# Patient Record
Sex: Male | Born: 1960 | Race: White | Hispanic: No | Marital: Single | Smoking: Never smoker
Health system: Southern US, Community
[De-identification: ages and names within clinical notes are randomized; demographics above are authoritative.]

## PROBLEM LIST (undated history)

## (undated) DIAGNOSIS — F419 Anxiety disorder, unspecified: Secondary | ICD-10-CM

## (undated) HISTORY — DX: Anxiety disorder, unspecified: F41.9

---

## 1999-01-15 ENCOUNTER — Ambulatory Visit (HOSPITAL_COMMUNITY): Admission: RE | Admit: 1999-01-15 | Discharge: 1999-01-15 | Payer: Self-pay | Admitting: Psychiatry

## 1999-01-18 ENCOUNTER — Other Ambulatory Visit (HOSPITAL_COMMUNITY): Admission: RE | Admit: 1999-01-18 | Discharge: 1999-02-04 | Payer: Self-pay | Admitting: Psychiatry

## 2001-10-12 ENCOUNTER — Emergency Department (HOSPITAL_COMMUNITY): Admission: EM | Admit: 2001-10-12 | Discharge: 2001-10-12 | Payer: Self-pay | Admitting: Emergency Medicine

## 2001-10-15 ENCOUNTER — Emergency Department (HOSPITAL_COMMUNITY): Admission: EM | Admit: 2001-10-15 | Discharge: 2001-10-15 | Payer: Self-pay | Admitting: Emergency Medicine

## 2001-11-04 ENCOUNTER — Emergency Department (HOSPITAL_COMMUNITY): Admission: EM | Admit: 2001-11-04 | Discharge: 2001-11-05 | Payer: Self-pay | Admitting: Emergency Medicine

## 2001-11-05 ENCOUNTER — Encounter: Payer: Self-pay | Admitting: Emergency Medicine

## 2002-01-17 ENCOUNTER — Emergency Department (HOSPITAL_COMMUNITY): Admission: EM | Admit: 2002-01-17 | Discharge: 2002-01-17 | Payer: Self-pay | Admitting: Emergency Medicine

## 2004-02-09 ENCOUNTER — Ambulatory Visit: Payer: Self-pay | Admitting: Family Medicine

## 2004-03-17 ENCOUNTER — Ambulatory Visit: Payer: Self-pay | Admitting: Family Medicine

## 2004-06-08 ENCOUNTER — Ambulatory Visit: Payer: Self-pay | Admitting: Family Medicine

## 2004-06-14 ENCOUNTER — Ambulatory Visit: Payer: Self-pay | Admitting: Family Medicine

## 2004-07-04 ENCOUNTER — Ambulatory Visit: Payer: Self-pay | Admitting: Family Medicine

## 2004-07-14 ENCOUNTER — Ambulatory Visit: Payer: Self-pay | Admitting: Family Medicine

## 2004-07-20 ENCOUNTER — Ambulatory Visit: Payer: Self-pay | Admitting: Family Medicine

## 2004-07-25 ENCOUNTER — Ambulatory Visit: Payer: Self-pay | Admitting: Family Medicine

## 2004-08-04 ENCOUNTER — Ambulatory Visit: Payer: Self-pay | Admitting: Internal Medicine

## 2004-08-11 ENCOUNTER — Ambulatory Visit: Payer: Self-pay | Admitting: Family Medicine

## 2004-08-18 ENCOUNTER — Ambulatory Visit: Payer: Self-pay | Admitting: Internal Medicine

## 2004-08-24 ENCOUNTER — Ambulatory Visit: Payer: Self-pay | Admitting: Family Medicine

## 2004-11-09 ENCOUNTER — Ambulatory Visit: Payer: Self-pay | Admitting: Family Medicine

## 2004-12-21 ENCOUNTER — Ambulatory Visit: Payer: Self-pay | Admitting: Family Medicine

## 2005-01-04 ENCOUNTER — Ambulatory Visit: Payer: Self-pay | Admitting: Family Medicine

## 2009-09-01 ENCOUNTER — Encounter (INDEPENDENT_AMBULATORY_CARE_PROVIDER_SITE_OTHER): Payer: Self-pay | Admitting: *Deleted

## 2010-05-10 NOTE — Letter (Signed)
Summary: Colonoscopy Letter  [facility]  [address] [place], [place] [address]   Phone: [phone]  Fax: [phone]      Sep 01, 2009 MRN: [id number]   Dean Martin [address] [place], [place]  [address]   Dear Mr. KILMER,   According to your medical record, it is time for you to schedule a Colonoscopy. The American Cancer Society recommends this procedure as a method to detect early colon cancer. Patients with a family history of colon cancer, or a personal history of colon polyps or inflammatory bowel disease are at increased risk.  This letter has beeen generated based on the recommendations made at the time of your procedure. If you feel that in your particular situation this may no longer apply, please contact our office.  Please call our office at [phone] to schedule this appointment or to update your records at your earliest convenience.  Thank you for cooperating with [place] to provide you with the very best care possible.   Sincerely,  Iva Boop, M.D.  [facility] [phone]

## 2010-11-17 ENCOUNTER — Telehealth: Payer: Self-pay | Admitting: Gastroenterology

## 2010-11-17 NOTE — Telephone Encounter (Signed)
Called all numbers in the system, one number not working and work number-the patient hasn't worked there in over ten years. Noted to send the patient a letter.

## 2011-07-18 ENCOUNTER — Encounter: Payer: Self-pay | Admitting: Internal Medicine

## 2019-10-30 ENCOUNTER — Telehealth (INDEPENDENT_AMBULATORY_CARE_PROVIDER_SITE_OTHER): Payer: 59 | Admitting: Adult Health

## 2019-10-30 ENCOUNTER — Encounter: Payer: Self-pay | Admitting: Adult Health

## 2019-10-30 VITALS — Ht 71.0 in | Wt 165.0 lb

## 2019-10-30 DIAGNOSIS — G629 Polyneuropathy, unspecified: Secondary | ICD-10-CM | POA: Diagnosis not present

## 2019-10-30 DIAGNOSIS — F419 Anxiety disorder, unspecified: Secondary | ICD-10-CM | POA: Diagnosis not present

## 2019-10-30 DIAGNOSIS — N529 Male erectile dysfunction, unspecified: Secondary | ICD-10-CM

## 2019-10-30 DIAGNOSIS — Z7689 Persons encountering health services in other specified circumstances: Secondary | ICD-10-CM

## 2019-10-30 MED ORDER — ALPRAZOLAM 0.25 MG PO TABS: 0.2500 mg | ORAL_TABLET | Freq: Two times a day (BID) | ORAL | 2 refills | Status: AC

## 2019-10-30 MED ORDER — SILDENAFIL CITRATE 100 MG PO TABS: 100.0000 mg | ORAL_TABLET | Freq: Every day | ORAL | 3 refills | Status: DC | PRN

## 2019-10-30 NOTE — Progress Notes (Signed)
Virtual Visit via Video Note  I connected withJohn B Martin on 10/30/19 at  4:00 PM EDT by a video enabled telemedicine application and verified that I am speaking with the correct person using two identifiers.  Location patient: home Location provider:work or home office Persons participating in the virtual visit: patient, provider  I discussed the limitations of evaluation and management by telemedicine and the availability of in person appointments. The patient expressed understanding and agreed to proceed.   Patient presents to clinic today to establish care.  He was originally from [place] but spent the last 9 years in [place] and has since moved back to [place], [place]. He was being seen by a PCP at [facility]   Acute Concerns: Establish Care   Chronic Issues: Anxiety - takes Xanax 0.25 mg BID. Feels like this works well for him.   ED - takes Viagra as needed  Neuropathy -ports that he has been seen by specialist in the past and has been tried on gabapentin, Lyrica, and Mirapex without symptomatic relief.  Some days are better than others and can go a couple of weeks without symptoms but then some tingling comes back.  Health Maintenance: Dental --Routine  Vision -- Routine  Immunizations -- UTD Colonoscopy -- 2016 - no polyps  Exercise: Does not exercise but stays active with work  Diet: Tries to eat healthy     Past Medical History:  Diagnosis Date  . Anxiety     History reviewed. No pertinent surgical history.  Current Outpatient Medications on File Prior to Visit  Medication Sig Dispense Refill  . ALPRAZolam (XANAX) 0.25 MG tablet Take 0.25 mg by mouth 2 (two) times daily as needed for anxiety.    . sildenafil (REVATIO) 20 MG tablet TAKE 1-5 TABS AS NEEDED     No current facility-administered medications on file prior to visit.    No Known Allergies  Family History  Problem Relation Age of Onset  . Colon cancer Father   . Diabetes Father      Social History   Socioeconomic History  . Marital status: Single    Spouse name: Not on file  . Number of children: Not on file  . Years of education: Not on file  . Highest education level: Not on file  Occupational History  . Not on file  Tobacco Use  . Smoking status: Never Smoker  . Smokeless tobacco: Never Used  Vaping Use  . Vaping Use: Never used  Substance and Sexual Activity  . Alcohol use: Not on file    Comment: OCCASIONAL  . Drug use: Never  . Sexual activity: Yes    Partners: Female  Other Topics Concern  . Not on file  Social History Narrative  . Not on file   Social Determinants of Health   Financial Resource Strain:   . Difficulty of Paying Living Expenses:   Food Insecurity:   . Worried About Programme researcher, broadcasting/film/video in the Last Year:   . Barista in the Last Year:   Transportation Needs:   . Freight forwarder (Medical):   Marland Kitchen Lack of Transportation (Non-Medical):   Physical Activity:   . Days of Exercise per Week:   . Minutes of Exercise per Session:   Stress:   . Feeling of Stress :   Social Connections:   . Frequency of Communication with Friends and Family:   . Frequency of Social Gatherings with Friends and Family:   .  Attends Religious Services:   . Active Member of Clubs or Organizations:   . Attends Banker Meetings:   Marland Kitchen Marital Status:   Intimate Partner Violence:   . Fear of Current or Ex-Partner:   . Emotionally Abused:   Marland Kitchen Physically Abused:   . Sexually Abused:     Review of Systems  Constitutional: Negative.   HENT: Negative.   Eyes: Negative.   Respiratory: Negative.   Cardiovascular: Negative.   Gastrointestinal: Negative.   Genitourinary: Negative.   Musculoskeletal: Negative.   Skin: Negative.   Neurological: Negative.   Endo/Heme/Allergies: Negative.   Psychiatric/Behavioral: The patient is nervous/anxious.   All other systems reviewed and are negative.   Ht 5\' 11"  (1.803 m)   Wt 165  lb (74.8 kg)   BMI 23.01 kg/m   Physical Exam VITALS per patient if applicable:  GENERAL: alert, oriented, appears well and in no acute distress  HEENT: atraumatic, conjunttiva clear, no obvious abnormalities on inspection of external nose and ears  NECK: normal movements of the head and neck  LUNGS: on inspection no signs of respiratory distress, breathing rate appears normal, no obvious gross SOB, gasping or wheezing  CV: no obvious cyanosis  MS: moves all visible extremities without noticeable abnormality  PSYCH/NEURO: pleasant and cooperative, no obvious depression or anxiety, speech and thought processing grossly intact  No results found for this or any previous visit (from the past 2160 hour(s)).  Assessment/Plan:  Discussed the following assessment and plan:  1. Encounter to establish care - Follow up for CPE - needs to exercise and eat healthier    2. Anxiety - Controlled substance database reviewed and no red flags found  - ALPRAZolam (XANAX) 0.25 MG tablet; Take 1 tablet (0.25 mg total) by mouth 2 (two) times daily.  Dispense: 60 tablet; Refill: 2  3. Erectile dysfunction, unspecified erectile dysfunction type  - sildenafil (VIAGRA) 100 MG tablet; Take 1 tablet (100 mg total) by mouth daily as needed for erectile dysfunction.  Dispense: 30 tablet; Refill: 3  4. Neuropathy - Will have him follow up in office for further evaluation      I discussed the assessment and treatment plan with the patient. The patient was provided an opportunity to ask questions and all were answered. The patient agreed with the plan and demonstrated an understanding of the instructions.   The patient was advised to call back or seek an in-person evaluation if the symptoms worsen or if the condition fails to improve as anticipated.   2161, NP

## 2020-02-05 ENCOUNTER — Encounter: Payer: Self-pay | Admitting: Adult Health

## 2020-02-05 ENCOUNTER — Telehealth (INDEPENDENT_AMBULATORY_CARE_PROVIDER_SITE_OTHER): Payer: 59 | Admitting: Adult Health

## 2020-02-05 VITALS — Wt 162.0 lb

## 2020-02-05 DIAGNOSIS — M8949 Other hypertrophic osteoarthropathy, multiple sites: Secondary | ICD-10-CM

## 2020-02-05 DIAGNOSIS — T63441A Toxic effect of venom of bees, accidental (unintentional), initial encounter: Secondary | ICD-10-CM

## 2020-02-05 DIAGNOSIS — F419 Anxiety disorder, unspecified: Secondary | ICD-10-CM

## 2020-02-05 MED ORDER — EPINEPHRINE 0.3 MG/0.3ML IJ SOAJ: 0.3000 mg | INTRAMUSCULAR | 1 refills | Status: AC | PRN

## 2020-02-05 MED ORDER — ALPRAZOLAM 0.25 MG PO TABS: 0.2500 mg | ORAL_TABLET | Freq: Two times a day (BID) | ORAL | 2 refills | Status: DC | PRN

## 2020-02-05 MED ORDER — IBUPROFEN 600 MG PO TABS: 600.0000 mg | ORAL_TABLET | Freq: Four times a day (QID) | ORAL | 0 refills | Status: DC | PRN

## 2020-02-05 NOTE — Progress Notes (Signed)
Virtual Visit via Video Note  I connected with Dean Martin on 02/05/20 at  4:00 PM EDT by a video enabled telemedicine application and verified that I am speaking with the correct person using two identifiers.  Location patient: home Location provider:work or home office Persons participating in the virtual visit: patient, provider  I discussed the limitations of evaluation and management by telemedicine and the availability of in person appointments. The patient expressed understanding and agreed to proceed.   HPI: 59 year old male being evaluated today for medication refills.  He has an upcoming physical roughly 5 weeks.  He takes Xanax 0.25 mg daily as needed and needs a refill of this medication.  He also needs a refill of Motrin 600 mg that he takes as needed for aches and pains as well as a refill of his EpiPen.  He reports that he is doing well and has no other complaints   ROS: See pertinent positives and negatives per HPI.  Past Medical History:  Diagnosis Date   Anxiety     History reviewed. No pertinent surgical history.  Family History  Problem Relation Age of Onset   Colon cancer Father    Diabetes Father        Current Outpatient Medications:    ALPRAZolam (XANAX) 0.25 MG tablet, Take 0.25 mg by mouth 2 (two) times daily as needed for anxiety., Disp: , Rfl:    sildenafil (REVATIO) 20 MG tablet, TAKE 1-5 TABS AS NEEDED, Disp: , Rfl:    sildenafil (VIAGRA) 100 MG tablet, Take 1 tablet (100 mg total) by mouth daily as needed for erectile dysfunction., Disp: 30 tablet, Rfl: 3  EXAM:  VITALS per patient if applicable:  GENERAL: alert, oriented, appears well and in no acute distress  HEENT: atraumatic, conjunttiva clear, no obvious abnormalities on inspection of external nose and ears  NECK: normal movements of the head and neck  LUNGS: on inspection no signs of respiratory distress, breathing rate appears normal, no obvious gross SOB, gasping or  wheezing  CV: no obvious cyanosis  MS: moves all visible extremities without noticeable abnormality  PSYCH/NEURO: pleasant and cooperative, no obvious depression or anxiety, speech and thought processing grossly intact  ASSESSMENT AND PLAN:  Discussed the following assessment and plan:  No diagnosis found.     I discussed the assessment and treatment plan with the patient. The patient was provided an opportunity to ask questions and all were answered. The patient agreed with the plan and demonstrated an understanding of the instructions.   The patient was advised to call back or seek an in-person evaluation if the symptoms worsen or if the condition fails to improve as anticipated.   Shirline Frees, NP

## 2020-02-11 ENCOUNTER — Telehealth: Payer: Self-pay | Admitting: Adult Health

## 2020-02-11 NOTE — Telephone Encounter (Signed)
Patient pick up ALPRAZolam (XANAX) 0.25 MG tablet  Last night and it is only 30 tablets, he normally gets 60 tablets since he takes twice daily.   Can you send in the other half to  [id number] [place], [place] - [address] AT [facility] [address] Phone:  [phone]  Fax:  [phone]

## 2020-02-12 ENCOUNTER — Other Ambulatory Visit: Payer: Self-pay | Admitting: Adult Health

## 2020-02-12 DIAGNOSIS — F419 Anxiety disorder, unspecified: Secondary | ICD-10-CM

## 2020-02-12 MED ORDER — ALPRAZOLAM 0.25 MG PO TABS: 0.2500 mg | ORAL_TABLET | Freq: Two times a day (BID) | ORAL | 2 refills | Status: DC | PRN

## 2020-02-12 NOTE — Telephone Encounter (Signed)
I am sorry about the mix up.  A new prescription was sent in but he will have to wait for it to be filled until he is done with this current prescription for xanax

## 2020-02-16 NOTE — Telephone Encounter (Signed)
Left a message for the pt to return a call to the office.   

## 2020-02-22 ENCOUNTER — Other Ambulatory Visit: Payer: Self-pay | Admitting: Adult Health

## 2020-02-22 DIAGNOSIS — M8949 Other hypertrophic osteoarthropathy, multiple sites: Secondary | ICD-10-CM

## 2020-02-22 DIAGNOSIS — M159 Polyosteoarthritis, unspecified: Secondary | ICD-10-CM

## 2020-03-17 ENCOUNTER — Ambulatory Visit (INDEPENDENT_AMBULATORY_CARE_PROVIDER_SITE_OTHER): Payer: 59 | Admitting: Adult Health

## 2020-03-17 ENCOUNTER — Other Ambulatory Visit: Payer: Self-pay

## 2020-03-17 ENCOUNTER — Encounter: Payer: Self-pay | Admitting: Adult Health

## 2020-03-17 VITALS — BP 144/76 | HR 79 | Temp 97.8°F | Ht 71.0 in | Wt 163.9 lb

## 2020-03-17 DIAGNOSIS — Z0000 Encounter for general adult medical examination without abnormal findings: Secondary | ICD-10-CM | POA: Diagnosis not present

## 2020-03-17 DIAGNOSIS — G629 Polyneuropathy, unspecified: Secondary | ICD-10-CM | POA: Diagnosis not present

## 2020-03-17 DIAGNOSIS — N529 Male erectile dysfunction, unspecified: Secondary | ICD-10-CM | POA: Diagnosis not present

## 2020-03-17 DIAGNOSIS — G4700 Insomnia, unspecified: Secondary | ICD-10-CM

## 2020-03-17 MED ORDER — AMITRIPTYLINE HCL 50 MG PO TABS: 50.0000 mg | ORAL_TABLET | Freq: Every day | ORAL | 1 refills | Status: DC

## 2020-03-17 MED ORDER — SILDENAFIL CITRATE 100 MG PO TABS: 100.0000 mg | ORAL_TABLET | Freq: Every day | ORAL | 3 refills | Status: DC | PRN

## 2020-03-17 NOTE — Progress Notes (Signed)
Subjective:    Patient ID: Dean Martin, male    DOB: 1960/09/19, 59 y.o.   MRN: [id number]  HPI Patient presents for yearly preventative medicine examination. He is a pleasant 59 year old male who  has a past medical history of Anxiety.  Insomnia/Anxiety -well-controlled with Xanax 0.25 mg twice daily but he would like to try and wean himself off this medication.   Erectile dysfunction-takes Viagra as needed  Neuropathy - has tried Gabapentin, Lycria and requip in the past without success. Neuropathy is worse at night. He has to get out of bed and walk around to help alleviate his symptoms   All immunizations and health maintenance protocols were reviewed with the patient and needed orders were placed.  Appropriate screening laboratory values were ordered for the patient including screening of hyperlipidemia, renal function and hepatic function. If indicated by BPH, a PSA was ordered.  Medication reconciliation,  past medical history, social history, problem list and allergies were reviewed in detail with the patient  Goals were established with regard to weight loss, exercise, and  diet in compliance with medications Wt Readings from Last 3 Encounters:  02/05/20 162 lb (73.5 kg)  10/30/19 165 lb (74.8 kg)    He is up to date on routine colon cancer screening. Had his first colonscopy at age 49 and had polyps, his second colonoscopy in 2016, which was normal and he was advised to get them every 10 years.    Review of Systems  Constitutional: Negative.   HENT: Negative.   Eyes: Negative.   Respiratory: Negative.   Cardiovascular: Negative.   Gastrointestinal: Negative.   Endocrine: Negative.   Genitourinary: Negative.   Musculoskeletal: Negative.   Skin: Negative.   Allergic/Immunologic: Negative.   Neurological: Positive for numbness.  Hematological: Negative.   Psychiatric/Behavioral: Negative.   All other systems reviewed and are negative.  Past Medical History:   Diagnosis Date  . Anxiety     Social History   Socioeconomic History  . Marital status: Single    Spouse name: Not on file  . Number of children: Not on file  . Years of education: Not on file  . Highest education level: Not on file  Occupational History  . Not on file  Tobacco Use  . Smoking status: Never Smoker  . Smokeless tobacco: Never Used  Vaping Use  . Vaping Use: Never used  Substance and Sexual Activity  . Alcohol use: Not on file    Comment: OCCASIONAL  . Drug use: Never  . Sexual activity: Yes    Partners: Female  Other Topics Concern  . Not on file  Social History Narrative  . Not on file   Social Determinants of Health   Financial Resource Strain:   . Difficulty of Paying Living Expenses: Not on file  Food Insecurity:   . Worried About Charity fundraiser in the Last Year: Not on file  . Ran Out of Food in the Last Year: Not on file  Transportation Needs:   . Lack of Transportation (Medical): Not on file  . Lack of Transportation (Non-Medical): Not on file  Physical Activity:   . Days of Exercise per Week: Not on file  . Minutes of Exercise per Session: Not on file  Stress:   . Feeling of Stress : Not on file  Social Connections:   . Frequency of Communication with Friends and Family: Not on file  . Frequency of Social Gatherings with Friends and Family:  Not on file  . Attends Religious Services: Not on file  . Active Member of Clubs or Organizations: Not on file  . Attends Archivist Meetings: Not on file  . Marital Status: Not on file  Intimate Partner Violence:   . Fear of Current or Ex-Partner: Not on file  . Emotionally Abused: Not on file  . Physically Abused: Not on file  . Sexually Abused: Not on file    No past surgical history on file.  Family History  Problem Relation Age of Onset  . Colon cancer Father   . Diabetes Father     No Known Allergies  Current Outpatient Medications on File Prior to Visit  Medication  Sig Dispense Refill  . ALPRAZolam (XANAX) 0.25 MG tablet Take 1 tablet (0.25 mg total) by mouth 2 (two) times daily as needed for anxiety. 60 tablet 2  . EPINEPHrine 0.3 mg/0.3 mL IJ SOAJ injection Inject 0.3 mg into the muscle as needed for anaphylaxis. 1 each 1  . ibuprofen (ADVIL) 600 MG tablet TAKE 1 TABLET BY MOUTH EVERY 6 HOURS AS NEEDED 90 tablet 0  . sildenafil (REVATIO) 20 MG tablet TAKE 1-5 TABS AS NEEDED    . sildenafil (VIAGRA) 100 MG tablet Take 1 tablet (100 mg total) by mouth daily as needed for erectile dysfunction. 30 tablet 3   No current facility-administered medications on file prior to visit.    There were no vitals taken for this visit.      Objective:   Physical Exam Vitals and nursing note reviewed.  Constitutional:      General: He is not in acute distress.    Appearance: Normal appearance. He is well-developed and normal weight.  HENT:     Head: Normocephalic and atraumatic.     Right Ear: Tympanic membrane, ear canal and external ear normal. There is no impacted cerumen.     Left Ear: Tympanic membrane, ear canal and external ear normal. There is no impacted cerumen.     Nose: Nose normal. No congestion or rhinorrhea.     Mouth/Throat:     Mouth: Mucous membranes are moist.     Pharynx: Oropharynx is clear. No oropharyngeal exudate or posterior oropharyngeal erythema.  Eyes:     General:        Right eye: No discharge.        Left eye: No discharge.     Extraocular Movements: Extraocular movements intact.     Conjunctiva/sclera: Conjunctivae normal.     Pupils: Pupils are equal, round, and reactive to light.  Neck:     Vascular: No carotid bruit.     Trachea: No tracheal deviation.  Cardiovascular:     Rate and Rhythm: Normal rate and regular rhythm.     Pulses: Normal pulses.     Heart sounds: Normal heart sounds. No murmur heard.  No friction rub. No gallop.   Pulmonary:     Effort: Pulmonary effort is normal. No respiratory distress.      Breath sounds: Normal breath sounds. No stridor. No wheezing, rhonchi or rales.  Chest:     Chest wall: No tenderness.  Abdominal:     General: Bowel sounds are normal. There is no distension.     Palpations: Abdomen is soft. There is no mass.     Tenderness: There is no abdominal tenderness. There is no right CVA tenderness, left CVA tenderness, guarding or rebound.     Hernia: No hernia is present.  Musculoskeletal:  General: No swelling, tenderness, deformity or signs of injury. Normal range of motion.     Right lower leg: No edema.     Left lower leg: No edema.  Lymphadenopathy:     Cervical: No cervical adenopathy.  Skin:    General: Skin is warm and dry.     Capillary Refill: Capillary refill takes less than 2 seconds.     Coloration: Skin is not jaundiced or pale.     Findings: No bruising, erythema, lesion or rash.  Neurological:     General: No focal deficit present.     Mental Status: He is alert and oriented to person, place, and time.     Cranial Nerves: No cranial nerve deficit.     Sensory: No sensory deficit.     Motor: No weakness.     Coordination: Coordination normal.     Gait: Gait normal.     Deep Tendon Reflexes: Reflexes normal.  Psychiatric:        Mood and Affect: Mood normal.        Behavior: Behavior normal.        Thought Content: Thought content normal.        Judgment: Judgment normal.       Assessment & Plan:  1. Routine general medical examination at a health care facility - Benign exam  - Follow up in one year or sooner if needed - CBC with Differential/Platelet; Future - Lipid panel; Future - TSH; Future - PSA; Future - Hemoglobin A1c; Future - CMP with eGFR([facility]); Future  2. Neuropathy - Will trial him on Elavil - advised follow up in 2-3 weeks if not improving  - amitriptyline (ELAVIL) 50 MG tablet; Take 1 tablet (50 mg total) by mouth at bedtime.  Dispense: 90 tablet; Refill: 1  3. Insomnia, unspecified type - Will have  him wean off Xanax and try elavil for sleep disorder.  - amitriptyline (ELAVIL) 50 MG tablet; Take 1 tablet (50 mg total) by mouth at bedtime.  Dispense: 90 tablet; Refill: 1  4. Erectile dysfunction, unspecified erectile dysfunction type  - sildenafil (VIAGRA) 100 MG tablet; Take 1 tablet (100 mg total) by mouth daily as needed for erectile dysfunction.  Dispense: 30 tablet; Refill: 3   Dorothyann Peng, NP

## 2020-03-17 NOTE — Patient Instructions (Signed)
It was great seeing you today   Please schedule your labs at the front desk   Let me know about your Elavil for the neuropathy and insomnia

## 2020-04-06 ENCOUNTER — Encounter: Payer: Self-pay | Admitting: Adult Health

## 2020-04-06 ENCOUNTER — Telehealth (INDEPENDENT_AMBULATORY_CARE_PROVIDER_SITE_OTHER): Payer: 59 | Admitting: Adult Health

## 2020-04-06 VITALS — Ht 71.0 in | Wt 163.0 lb

## 2020-04-06 DIAGNOSIS — R42 Dizziness and giddiness: Secondary | ICD-10-CM

## 2020-04-06 NOTE — Progress Notes (Signed)
Virtual Visit via Video Note  I connected with Dean Martin on 04/06/20 at  4:30 PM EST by a video enabled telemedicine application and verified that I am speaking with the correct person using two identifiers.  Location patient: home Location provider:work or home office Persons participating in the virtual visit: patient, provider  I discussed the limitations of evaluation and management by telemedicine and the availability of in person appointments. The patient expressed understanding and agreed to proceed.   HPI: 59 year old male who is being evaluated today virtually for vertigo.  He reports that 2 weeks ago he started to experience vertigo and "lingered" till about 4 to 5 days ago.  He took Dramamine which helped alleviate the symptoms.  Currently not having any symptoms.  Has had one episode of vertigo many years ago.   ROS: See pertinent positives and negatives per HPI.  Past Medical History:  Diagnosis Date  . Anxiety     History reviewed. No pertinent surgical history.  Family History  Problem Relation Age of Onset  . Colon cancer Father   . Diabetes Father        Current Outpatient Medications:  .  ALPRAZolam (XANAX) 0.25 MG tablet, Take 1 tablet (0.25 mg total) by mouth 2 (two) times daily as needed for anxiety., Disp: 60 tablet, Rfl: 2 .  amitriptyline (ELAVIL) 50 MG tablet, Take 1 tablet (50 mg total) by mouth at bedtime., Disp: 90 tablet, Rfl: 1 .  EPINEPHrine 0.3 mg/0.3 mL IJ SOAJ injection, Inject 0.3 mg into the muscle as needed for anaphylaxis., Disp: 1 each, Rfl: 1  EXAM:  VITALS per patient if applicable:  GENERAL: alert, oriented, appears well and in no acute distress  HEENT: atraumatic, conjunttiva clear, no obvious abnormalities on inspection of external nose and ears  NECK: normal movements of the head and neck  LUNGS: on inspection no signs of respiratory distress, breathing rate appears normal, no obvious gross SOB, gasping or  wheezing  CV: no obvious cyanosis  MS: moves all visible extremities without noticeable abnormality  PSYCH/NEURO: pleasant and cooperative, no obvious depression or anxiety, speech and thought processing grossly intact  ASSESSMENT AND PLAN:  Discussed the following assessment and plan:  1. Vertigo -Symptoms have resolved.  He was advised to follow-up if symptoms return.  Can consider referral to vestibular rehab     I discussed the assessment and treatment plan with the patient. The patient was provided an opportunity to ask questions and all were answered. The patient agreed with the plan and demonstrated an understanding of the instructions.   The patient was advised to call back or seek an in-person evaluation if the symptoms worsen or if the condition fails to improve as anticipated.   Shirline Frees, NP

## 2020-04-16 ENCOUNTER — Other Ambulatory Visit (INDEPENDENT_AMBULATORY_CARE_PROVIDER_SITE_OTHER): Payer: 59

## 2020-04-16 ENCOUNTER — Other Ambulatory Visit: Payer: Self-pay

## 2020-04-16 ENCOUNTER — Other Ambulatory Visit: Payer: Self-pay | Admitting: Family Medicine

## 2020-04-16 DIAGNOSIS — Z0000 Encounter for general adult medical examination without abnormal findings: Secondary | ICD-10-CM

## 2020-04-16 LAB — COMPREHENSIVE METABOLIC PANEL
ALT: 13 U/L (ref 0–53)
AST: 15 U/L (ref 0–37)
Albumin: 4.6 g/dL (ref 3.5–5.2)
Alkaline Phosphatase: 38 U/L — ABNORMAL LOW (ref 39–117)
BUN: 25 mg/dL — ABNORMAL HIGH (ref 6–23)
CO2: 28 mEq/L (ref 19–32)
Calcium: 9.7 mg/dL (ref 8.4–10.5)
Chloride: 103 mEq/L (ref 96–112)
Creatinine, Ser: 1.07 mg/dL (ref 0.40–1.50)
GFR: 75.16 mL/min (ref >60.00)
Glucose, Bld: 102 mg/dL — ABNORMAL HIGH (ref 70–99)
Potassium: 4.4 mEq/L (ref 3.5–5.1)
Sodium: 137 mEq/L (ref 135–145)
Total Bilirubin: 1.4 mg/dL — ABNORMAL HIGH (ref 0.2–1.2)
Total Protein: 6.9 g/dL (ref 6.0–8.3)

## 2020-04-16 LAB — CBC
HCT: 45.3 % (ref 39.0–52.0)
Hemoglobin: 15.5 g/dL (ref 13.0–17.0)
MCHC: 34.1 g/dL (ref 30.0–36.0)
MCV: 87.9 fl (ref 78.0–100.0)
Platelets: 240 10*3/uL (ref 150.0–400.0)
RBC: 5.15 Mil/uL (ref 4.22–5.81)
RDW: 13.4 % (ref 11.5–15.5)
WBC: 5.9 10*3/uL (ref 4.0–10.5)

## 2020-04-16 LAB — LIPID PANEL
Cholesterol: 317 mg/dL — ABNORMAL HIGH (ref 0–200)
HDL: 68.4 mg/dL (ref >39.00)
LDL Cholesterol: 234 mg/dL — ABNORMAL HIGH (ref 0–99)
NonHDL: 248.66
Total CHOL/HDL Ratio: 5
Triglycerides: 72 mg/dL (ref 0.0–149.0)
VLDL: 14.4 mg/dL (ref 0.0–40.0)

## 2020-04-16 LAB — HEMOGLOBIN A1C: Hgb A1c MFr Bld: 5.9 % (ref 4.6–6.5)

## 2020-04-16 LAB — TSH: TSH: 0.93 u[IU]/mL (ref 0.35–4.50)

## 2020-04-16 LAB — PSA: PSA: 1.59 ng/mL (ref 0.10–4.00)

## 2020-04-16 MED ORDER — ATORVASTATIN CALCIUM 20 MG PO TABS: 20.0000 mg | ORAL_TABLET | Freq: Every day | ORAL | 3 refills | Status: DC

## 2020-04-16 NOTE — Telephone Encounter (Signed)
Sent to the pharmacy by e-scribe. 

## 2020-05-16 ENCOUNTER — Other Ambulatory Visit: Payer: Self-pay | Admitting: Adult Health

## 2020-05-16 DIAGNOSIS — F419 Anxiety disorder, unspecified: Secondary | ICD-10-CM

## 2020-05-17 NOTE — Telephone Encounter (Signed)
Pt is calling in needing a refill on Rx alprazolam Prudy Feeler) 0.25  Pharm:  [facility]

## 2020-06-15 ENCOUNTER — Other Ambulatory Visit: Payer: Self-pay | Admitting: Adult Health

## 2020-06-15 DIAGNOSIS — G4700 Insomnia, unspecified: Secondary | ICD-10-CM

## 2020-06-15 DIAGNOSIS — G629 Polyneuropathy, unspecified: Secondary | ICD-10-CM

## 2020-08-13 ENCOUNTER — Telehealth: Payer: Self-pay | Admitting: Adult Health

## 2020-08-13 ENCOUNTER — Other Ambulatory Visit: Payer: Self-pay | Admitting: Adult Health

## 2020-08-13 DIAGNOSIS — F419 Anxiety disorder, unspecified: Secondary | ICD-10-CM

## 2020-08-13 MED ORDER — ALPRAZOLAM 0.25 MG PO TABS: 0.2500 mg | ORAL_TABLET | Freq: Two times a day (BID) | ORAL | 2 refills | Status: DC | PRN

## 2020-08-13 NOTE — Telephone Encounter (Signed)
Pt call and stated he need a refill on his ALPRAZolam (XANAX) 0.25 MG tablet [facility] to  [id number] [place], [place] - [address] AT [facility] [address] Phone:  [phone]  Fax:  [phone]

## 2020-08-31 ENCOUNTER — Other Ambulatory Visit: Payer: Self-pay | Admitting: Adult Health

## 2020-08-31 DIAGNOSIS — G629 Polyneuropathy, unspecified: Secondary | ICD-10-CM

## 2020-08-31 DIAGNOSIS — G4700 Insomnia, unspecified: Secondary | ICD-10-CM

## 2020-09-08 ENCOUNTER — Ambulatory Visit: Payer: 59 | Admitting: Adult Health

## 2020-09-09 ENCOUNTER — Ambulatory Visit (INDEPENDENT_AMBULATORY_CARE_PROVIDER_SITE_OTHER): Payer: 59 | Admitting: Adult Health

## 2020-09-09 ENCOUNTER — Other Ambulatory Visit: Payer: Self-pay

## 2020-09-09 ENCOUNTER — Encounter: Payer: Self-pay | Admitting: Adult Health

## 2020-09-09 VITALS — BP 130/100 | HR 85 | Temp 98.9°F | Ht 71.0 in | Wt 164.0 lb

## 2020-09-09 DIAGNOSIS — H04203 Unspecified epiphora, bilateral lacrimal glands: Secondary | ICD-10-CM

## 2020-09-09 DIAGNOSIS — J302 Other seasonal allergic rhinitis: Secondary | ICD-10-CM | POA: Diagnosis not present

## 2020-09-09 NOTE — Progress Notes (Signed)
Subjective:    Patient ID: Dean Martin, male    DOB: November 09, 1960, 60 y.o.   MRN: [id number]  HPI 60 year old male who  has a past medical history of Anxiety.  He presents to the office today for an complaint of itchy watery eye ( right eye) and clear nasal drainage x 8 days. Denies eye pain, sinus pain/presure, fevers, chills. Has not really been using any OTC allergy medication. Started using clear eyes/visine eye drops without relief.    Review of Systems See HPI   Past Medical History:  Diagnosis Date  . Anxiety     Social History   Socioeconomic History  . Marital status: Single    Spouse name: Not on file  . Number of children: Not on file  . Years of education: Not on file  . Highest education level: Not on file  Occupational History  . Not on file  Tobacco Use  . Smoking status: Never Smoker  . Smokeless tobacco: Never Used  Vaping Use  . Vaping Use: Never used  Substance and Sexual Activity  . Alcohol use: Not on file    Comment: OCCASIONAL  . Drug use: Never  . Sexual activity: Yes    Partners: Female  Other Topics Concern  . Not on file  Social History Narrative  . Not on file   Social Determinants of Health   Financial Resource Strain: Not on file  Food Insecurity: Not on file  Transportation Needs: Not on file  Physical Activity: Not on file  Stress: Not on file  Social Connections: Not on file  Intimate Partner Violence: Not on file    History reviewed. No pertinent surgical history.  Family History  Problem Relation Age of Onset  . Colon cancer Father   . Diabetes Father     No Known Allergies  Current Outpatient Medications on File Prior to Visit  Medication Sig Dispense Refill  . ALPRAZolam (XANAX) 0.25 MG tablet Take 1 tablet (0.25 mg total) by mouth 2 (two) times daily as needed for anxiety. 60 tablet 2  . amitriptyline (ELAVIL) 50 MG tablet TAKE 1 TABLET BY MOUTH EVERYDAY AT BEDTIME 90 tablet 1  . atorvastatin (LIPITOR) 20  MG tablet Take 1 tablet (20 mg total) by mouth at bedtime. 90 tablet 3  . EPINEPHrine 0.3 mg/0.3 mL IJ SOAJ injection Inject 0.3 mg into the muscle as needed for anaphylaxis. 1 each 1   No current facility-administered medications on file prior to visit.    BP (!) 130/100   Pulse 85   Temp 98.9 F (37.2 C) (Oral)   Ht 5\' 11"  (1.803 m)   Wt 164 lb (74.4 kg)   SpO2 95%   BMI 22.87 kg/m       Objective:   Physical Exam Vitals and nursing note reviewed.  Constitutional:      Appearance: Normal appearance.  HENT:     Nose: Nose normal. No nasal tenderness, mucosal edema, congestion or rhinorrhea.     Right Turbinates: Not swollen.     Left Turbinates: Not swollen.  Eyes:     General: Lids are normal. Lids are everted, no foreign bodies appreciated. Vision grossly intact. Gaze aligned appropriately.        Right eye: No foreign body or hordeolum.     Extraocular Movements: Extraocular movements intact.     Right eye: Normal extraocular motion.     Left eye: Normal extraocular motion.     Conjunctiva/sclera: Conjunctivae  normal.     Right eye: No exudate.    Left eye: No exudate.    Comments: No drainage noted   Neurological:     Mental Status: He is alert.       Assessment & Plan:  Likely dry eye syndrome/seasonal allergies. Advised to stop clear eye/visine and get an OTC lubricating eye drop. Can also start Zyrtec or claritin   [employer]

## 2020-11-29 ENCOUNTER — Other Ambulatory Visit: Payer: Self-pay | Admitting: Adult Health

## 2020-11-29 DIAGNOSIS — F419 Anxiety disorder, unspecified: Secondary | ICD-10-CM

## 2020-11-30 NOTE — Telephone Encounter (Signed)
Okay for refill?    LOV 09/09/2020   Last Refill  08/13/2020 59     QTY.     2    Refills

## 2021-01-07 DIAGNOSIS — R209 Unspecified disturbances of skin sensation: Secondary | ICD-10-CM | POA: Insufficient documentation

## 2021-01-07 DIAGNOSIS — R531 Weakness: Secondary | ICD-10-CM | POA: Insufficient documentation

## 2021-01-07 DIAGNOSIS — H4910 Fourth [trochlear] nerve palsy, unspecified eye: Secondary | ICD-10-CM | POA: Diagnosis not present

## 2021-01-08 ENCOUNTER — Emergency Department (HOSPITAL_COMMUNITY)
Admission: EM | Admit: 2021-01-08 | Discharge: 2021-01-08 | Disposition: A | Discharge status: Home / Self Care | Payer: 59 | Attending: Emergency Medicine | Admitting: Emergency Medicine

## 2021-01-08 ENCOUNTER — Emergency Department (HOSPITAL_COMMUNITY): Payer: 59

## 2021-01-08 ENCOUNTER — Encounter (HOSPITAL_COMMUNITY): Payer: Self-pay | Admitting: *Deleted

## 2021-01-08 ENCOUNTER — Other Ambulatory Visit: Payer: Self-pay

## 2021-01-08 DIAGNOSIS — G589 Mononeuropathy, unspecified: Secondary | ICD-10-CM

## 2021-01-08 LAB — BASIC METABOLIC PANEL
Anion gap: 10 (ref 5–15)
BUN: 28 mg/dL — ABNORMAL HIGH (ref 8–23)
CO2: 21 mmol/L — ABNORMAL LOW (ref 22–32)
Calcium: 9.3 mg/dL (ref 8.9–10.3)
Chloride: 104 mmol/L (ref 98–111)
Creatinine, Ser: 1.17 mg/dL (ref 0.61–1.24)
GFR, Estimated: >60 mL/min (ref >60)
Glucose, Bld: 103 mg/dL — ABNORMAL HIGH (ref 70–99)
Potassium: 4.2 mmol/L (ref 3.5–5.1)
Sodium: 135 mmol/L (ref 135–145)

## 2021-01-08 LAB — CBC
HCT: 45.1 % (ref 39.0–52.0)
Hemoglobin: 15.4 g/dL (ref 13.0–17.0)
MCH: 30.3 pg (ref 26.0–34.0)
MCHC: 34.1 g/dL (ref 30.0–36.0)
MCV: 88.8 fL (ref 80.0–100.0)
Platelets: 258 10*3/uL (ref 150–400)
RBC: 5.08 MIL/uL (ref 4.22–5.81)
RDW: 13.2 % (ref 11.5–15.5)
WBC: 12.1 10*3/uL — ABNORMAL HIGH (ref 4.0–10.5)
nRBC: 0 % (ref 0.0–0.2)

## 2021-01-08 MED ORDER — GADOBUTROL 1 MMOL/ML IV SOLN
7.0000 mL | Freq: Once | INTRAVENOUS | Status: AC | PRN
  Administered 2021-01-08: 7 mL via INTRAVENOUS

## 2021-01-08 NOTE — ED Provider Notes (Signed)
Emergency Medicine Provider Triage Evaluation Note  Dean Martin , a 60 y.o. male  was evaluated in triage.  Pt complains of right arm numbness.  Patient states that he was asleep and woke up around 2030, states he was lying on his right arm and woke up 20 minutes later with numbness in his right arm and inability to move it.  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  He states that progressively his symptoms have been improving and his strength is returning.  He does endorse a remote history of demyelinating disease in his 76s.  He also has a history of peripheral neuropathy which he takes gabapentin and Lyrica for.  He denies any persistent numbness, word slurring, vision changes, headache, neck pain  Review of Systems  Positive: As above Negative: As above  Physical Exam  BP 126/89 (BP Location: Left Arm)   Pulse 94   Temp 98.4 F (36.9 C) (Oral)   Resp 16   Ht 5\' 11"  (1.803 m)   Wt 74.4 kg   SpO2 93%   BMI 22.88 kg/m  Gen:   Awake, no distress   Resp:  Normal effort  MSK:   Moves extremities without difficulty  Other:  No midline tenderness of the C, T, L-spine.  4/5 grip strength in the right arm compared to the left.  Sensations are intact.  Cranial nerves II through XII intact.  Medical Decision Making  Medically screening exam initiated at 12:41 AM.  Appropriate orders placed.  was informed that the remainder of the evaluation will be completed by another provider, this initial triage assessment does not replace that evaluation, and the importance of remaining in the ED until their evaluation is complete.   Given improving neurologic symptoms, will hold on calling a code stroke.I discussed the case with Dr. Malva Cogan and Dr. Adela Lank with neurology recommends MRI with and without contrast of the brain and cervical spine which I have ordered.  Patient was instructed that if his weakness or  numbness can get worse while he is waiting in the waiting room or does not improve, to let me know immediately we will proceed forward with calling a code stroke.    Derry Lory, PA-C 01/08/21 0044    03/10/21, DO 01/08/21 [phone]

## 2021-01-08 NOTE — ED Provider Notes (Signed)
[facility] EMERGENCY DEPARTMENT Provider Note   CSN: [id number] Arrival date & time: 01/07/21  2319     History Chief Complaint  Patient presents with   arm numbness    Dean Martin is a 60 y.o. male.  60 yo M with a chief complaints of right arm weakness.  Patient fell asleep on the couch and woke up and had difficulty extending his wrist and had to lift his arm up with his other hand and put it across his lap.  He had some tingling to that arm from about the mid upper arm down to the fingers.  He denies trauma to the area.  Denies intoxication.  Denies head or neck pain.  The patient has had a history of a demyelination syndrome where he ended up coming into the hospital for IV steroids.  This was some 20 years ago.  He has had some lower extremity neuropathy that he thinks is likely secondary to that.  Not currently followed by neurology.  The history is provided by the patient.  Illness Severity:  Moderate Onset quality:  Gradual Duration:  2 hours Timing:  Constant Progression:  Resolved Chronicity:  New Associated symptoms: no abdominal pain, no chest pain, no congestion, no diarrhea, no fever, no headaches, no myalgias, no rash, no shortness of breath and no vomiting       Past Medical History:  Diagnosis Date   Anxiety     There are no problems to display for this patient.   History reviewed. No pertinent surgical history.     Family History  Problem Relation Age of Onset   Colon cancer Father    Diabetes Father     Social History   Tobacco Use   Smoking status: Never   Smokeless tobacco: Never  Vaping Use   Vaping Use: Never used  Substance Use Topics   Drug use: Never    Home Medications Prior to Admission medications   Medication Sig Start Date End Date Taking? Authorizing Provider  ALPRAZolam (XANAX) 0.25 MG tablet TAKE 1 TABLET BY MOUTH 2 TIMES DAILY AS NEEDED FOR ANXIETY. 11/30/20   Nafziger, Kandee Keen, NP  amitriptyline  (ELAVIL) 50 MG tablet TAKE 1 TABLET BY MOUTH EVERYDAY AT BEDTIME 08/31/20   Nafziger, Kandee Keen, NP  atorvastatin (LIPITOR) 20 MG tablet Take 1 tablet (20 mg total) by mouth at bedtime. 04/16/20   Nafziger, Kandee Keen, NP  EPINEPHrine 0.3 mg/0.3 mL IJ SOAJ injection Inject 0.3 mg into the muscle as needed for anaphylaxis. 02/05/20   Shirline Frees, NP    Allergies    Patient has no known allergies.  Review of Systems   Review of Systems  Constitutional:  Negative for chills and fever.  HENT:  Negative for congestion and facial swelling.   Eyes:  Negative for discharge and visual disturbance.  Respiratory:  Negative for shortness of breath.   Cardiovascular:  Negative for chest pain and palpitations.  Gastrointestinal:  Negative for abdominal pain, diarrhea and vomiting.  Musculoskeletal:  Negative for arthralgias and myalgias.  Skin:  Negative for color change and rash.  Neurological:  Positive for weakness. Negative for tremors, syncope and headaches.  Psychiatric/Behavioral:  Negative for confusion and dysphoric mood.    Physical Exam Updated Vital Signs BP 123/83 (BP Location: Left Arm)   Pulse 74   Temp 97.6 F (36.4 C) (Oral)   Resp 18   Ht 5\' 11"  (1.803 m)   Wt 74.4 kg   SpO2 97%  BMI 22.88 kg/m   Physical Exam Vitals and nursing note reviewed.  Constitutional:      Appearance: He is well-developed.  HENT:     Head: Normocephalic and atraumatic.  Eyes:     Pupils: Pupils are equal, round, and reactive to light.  Neck:     Vascular: No JVD.  Cardiovascular:     Rate and Rhythm: Normal rate and regular rhythm.     Heart sounds: No murmur heard.   No friction rub. No gallop.  Pulmonary:     Effort: No respiratory distress.     Breath sounds: No wheezing.  Abdominal:     General: There is no distension.     Tenderness: There is no abdominal tenderness. There is no guarding or rebound.  Musculoskeletal:        General: Normal range of motion.     Cervical back: Normal  range of motion and neck supple.     Comments: Pulse motor and sensation are intact to bilateral upper extremities.  He has very mild weakness with extension of the right wrist otherwise benign exam.  Skin:    Coloration: Skin is not pale.     Findings: No rash.  Neurological:     Mental Status: He is alert and oriented to person, place, and time.  Psychiatric:        Behavior: Behavior normal.    ED Results / Procedures / Treatments   Labs (all labs ordered are listed, but only abnormal results are displayed) Labs Reviewed  CBC - Abnormal; Notable for the following components:      Result Value   WBC 12.1 (*)    All other components within normal limits  BASIC METABOLIC PANEL - Abnormal; Notable for the following components:   CO2 21 (*)    Glucose, Bld 103 (*)    BUN 28 (*)    All other components within normal limits    EKG None  Radiology MR Brain W and Wo Contrast  Result Date: 01/08/2021 CLINICAL DATA:  Right hand weakness. EXAM: MRI HEAD WITHOUT AND WITH CONTRAST TECHNIQUE: Multiplanar, multiecho pulse sequences of the brain and surrounding structures were obtained without and with intravenous contrast. CONTRAST:  57mL GADAVIST GADOBUTROL 1 MMOL/ML IV SOLN COMPARISON:  None. FINDINGS: Brain: No acute infarct, mass effect or extra-axial collection. No acute or chronic hemorrhage. Minimal multifocal hyperintense T2-weight signal within the white matter. The midline structures are normal. There is no abnormal contrast enhancement. Vascular: Major flow voids are preserved. Skull and upper cervical spine: Normal calvarium and skull base. Visualized upper cervical spine and soft tissues are normal. Sinuses/Orbits:No paranasal sinus fluid levels or advanced mucosal thickening. No mastoid or middle ear effusion. Normal orbits. IMPRESSION: 1. No acute intracranial abnormality. 2. No evidence of demyelinating disease. Electronically Signed   By: Deatra Robinson M.D.   On: 01/08/2021 02:57    MR Cervical Spine W or Wo Contrast  Result Date: 01/08/2021 CLINICAL DATA:  Right arm numbness and weakness. History of demyelinating disease. EXAM: MRI CERVICAL SPINE WITHOUT AND WITH CONTRAST TECHNIQUE: Multiplanar and multiecho pulse sequences of the cervical spine, to include the craniocervical junction and cervicothoracic junction, were obtained without and with intravenous contrast. CONTRAST:  62mL GADAVIST GADOBUTROL 1 MMOL/ML IV SOLN COMPARISON:  None. FINDINGS: Alignment: Grade 1 retrolisthesis at C4-5 and C5-6 Vertebrae: No fracture, evidence of discitis, or bone lesion. Cord: Normal signal and morphology. Posterior Fossa, vertebral arteries, paraspinal tissues: Negative. Disc levels: C1-2: Unremarkable. C2-3: Normal  disc space and facet joints. There is no spinal canal stenosis. No neural foraminal stenosis. C3-4: Normal disc space and facet joints. There is no spinal canal stenosis. No neural foraminal stenosis. C4-5: Left facet hypertrophy and mild left uncovertebral hypertrophy. There is no spinal canal stenosis. Severe left neural foraminal stenosis. C5-6: Small disc bulge with endplate and uncinate spurring. There is no spinal canal stenosis. Moderate left neural foraminal stenosis. C6-7: Right uncinate spurring and small disc bulge. There is no spinal canal stenosis. Mild right neural foraminal stenosis. C7-T1: Normal disc space and facet joints. There is no spinal canal stenosis. No neural foraminal stenosis. IMPRESSION: 1. No acute abnormality of the cervical spine. No evidence of demyelinating disease. 2. 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 3. Moderate left C5-6 and mild right C6-7 neural foraminal stenosis. 4. No spinal canal stenosis. Electronically Signed   By: Deatra Robinson M.D.   On: 01/08/2021 02:44    Procedures Procedures   Medications Ordered in ED Medications  gadobutrol (GADAVIST) 1 MMOL/ML injection 7 mL (7 mLs  Intravenous Contrast Given 01/08/21 0226)  gadobutrol (GADAVIST) 1 MMOL/ML injection 7 mL (7 mLs Intravenous Contrast Given 01/08/21 0233)    ED Course  I have reviewed the triage vital signs and the nursing notes.  Pertinent labs & imaging results that were available during my care of the patient were reviewed by me and considered in my medical decision making (see chart for details).    MDM Rules/Calculators/A&P                           60 yo M with a chief complaints of a focal nerve palsy.  Sounds like a nerve compression syndrome where he fell asleep and then woke up with his arm asleep and has had progressive improvement.  With his history of demyelination syndrome the case was discussed with neurology recommended an MRI.  This is negative with and without contrast in the head and the C-spine.  I discussed the results with the patient.  We will have him follow-up with neurology as an outpatient.  3:21 AM:  I have discussed the diagnosis/risks/treatment options with the patient and believe the pt to be eligible for discharge home to follow-up with PCP, neuro. We also discussed returning to the ED immediately if new or worsening sx occur. We discussed the sx which are most concerning (e.g., sudden worsening pain, fever, inability to tolerate by mouth) that necessitate immediate return. Medications administered to the patient during their visit and any new prescriptions provided to the patient are listed below.  Medications given during this visit Medications  gadobutrol (GADAVIST) 1 MMOL/ML injection 7 mL (7 mLs Intravenous Contrast Given 01/08/21 0226)  gadobutrol (GADAVIST) 1 MMOL/ML injection 7 mL (7 mLs Intravenous Contrast Given 01/08/21 0233)     The patient appears reasonably screen and/or stabilized for discharge and I doubt any other medical condition or other [facility] requiring further screening, evaluation, or treatment in the ED at this time prior to discharge.   Final Clinical  Impression(s) / ED Diagnoses Final diagnoses:  Peripheral nerve palsy    Rx / DC Orders ED Discharge Orders          Ordered    Ambulatory referral to Neurology       Comments: Peripheral nerve palsy   01/08/21 0316             Melene Plan, DO  01/08/21 0321  

## 2021-01-08 NOTE — Discharge Instructions (Signed)
Follow up with your PCP.  Follow up with neurology.  Return for recurrent or persistent symptoms.

## 2021-01-08 NOTE — ED Triage Notes (Signed)
The pt arrived by gems  he took a nap arounf 2030 lying on his rt arm  when he woke up 20 minutes later he had numbness in his rt arm  it is been getting gradually better  but he  feels like he cannot spread his rt fingers as wide as he once could hx of ms yesra ago

## 2021-01-10 ENCOUNTER — Encounter: Payer: Self-pay | Admitting: Neurology

## 2021-01-13 ENCOUNTER — Ambulatory Visit: Payer: 59 | Admitting: Neurology

## 2021-01-13 ENCOUNTER — Telehealth: Payer: Self-pay

## 2021-01-13 ENCOUNTER — Other Ambulatory Visit: Payer: Self-pay

## 2021-01-13 ENCOUNTER — Encounter: Payer: Self-pay | Admitting: Neurology

## 2021-01-13 VITALS — BP 142/82 | HR 89 | Ht 71.0 in | Wt 167.0 lb

## 2021-01-13 DIAGNOSIS — M79604 Pain in right leg: Secondary | ICD-10-CM | POA: Diagnosis not present

## 2021-01-13 DIAGNOSIS — M79605 Pain in left leg: Secondary | ICD-10-CM

## 2021-01-13 DIAGNOSIS — G5631 Lesion of radial nerve, right upper limb: Secondary | ICD-10-CM | POA: Diagnosis not present

## 2021-01-13 NOTE — Telephone Encounter (Signed)
Called patient and informed him that his pain is not characteristic of nerve pain, and he has not responded to medications that we use such as gabapentin and Lyrica, so Dr. Allena Katz recommends he follow-up with his PCP.   Patient verbalized understanding and had no further questions or concerns.

## 2021-01-13 NOTE — Progress Notes (Signed)
[facility] Neurology Division Clinic Note - Initial Visit   Date: 01/13/21  Dean Martin MRN: [id number] DOB: 12-Mar-1961   Dear Dr. Adela Lank:  Thank you for your kind referral of Dean Martin for consultation of right hand weakness. Although his history is well known to you, please allow [place] to reiterate it for the purpose of our medical record. The patient was accompanied to the clinic by self.    History of Present Illness: Dean Martin is a 60 y.o. left-handed male with anxiety and hyperlipidemia presenting for evaluation of right hand weakness. On September 30th, he was sitting in his lazy boy to watch TV and fell asleep leaning on his right arm for about 2 hours.  When he woke up, he had noticed complete right wrist drop.  He called 911 because of concern of stroke. MRI head was negative for stroke. 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  Over the past week, he has regained strength in the right arm, such that he can extend the wrist and fingers.  He continues to have numbness over radial distribution.  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    He also has chronic bilateral severe leg pain, described as a wrenching pain involving the entire lower legs.  No change in symptoms with activity.  This started back when he was 60 years old.  He tells me that in 1988, he was unable to void his urine and developed leg numbness.  He was hospitalized and told that he had single demyelinating event.  He was treated with ACTH for several months.  His MRI brain and cervical spine from last week does not show any demyelinating changes.  For his leg pain, he has tried gabapentin, Lyrica, and currently on amitriptyline.  He gets temporary relief with medications, but nothing lasting.  He has been told he has neuropathy.  He denies numbness/tingling of th legs.  No history of diabetes  or heavy alcohol use.    Out-side paper records, electronic medical record, and images have been reviewed where available and summarized as:  MRI cervical spine wwo contrast 01/08/2021: 1. No acute abnormality of the cervical spine. No evidence of demyelinating disease. 2. 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 3. Moderate left C5-6 and mild right C6-7 neural foraminal stenosis. 4. No spinal canal stenosis.   MRI brain wwo contrast 01/08/2021: 1. No acute intracranial abnormality. 2. No evidence of demyelinating disease.  Lab Results  Component Value Date   HGBA1C 5.9 04/16/2020   No results found for: VITAMINB12 Lab Results  Component Value Date   TSH 0.93 04/16/2020   No results found for: ESRSEDRATE, POCTSEDRATE  Past Medical History:  Diagnosis Date   Anxiety     History reviewed. No pertinent surgical history.   Medications:  Outpatient Encounter Medications as of 01/13/2021  Medication Sig   ALPRAZolam (XANAX) 0.25 MG tablet TAKE 1 TABLET BY MOUTH 2 TIMES DAILY AS NEEDED FOR ANXIETY.   amitriptyline (ELAVIL) 50 MG tablet TAKE 1 TABLET BY MOUTH EVERYDAY AT BEDTIME   atorvastatin (LIPITOR) 20 MG tablet Take 1 tablet (20 mg total) by mouth at bedtime.   EPINEPHrine 0.3 mg/0.3 mL IJ SOAJ injection Inject 0.3 mg into the muscle as needed for anaphylaxis.   No facility-administered encounter medications on file as of 01/13/2021.    Allergies: No  Known Allergies  Family History: Family History  Problem Relation Age of Onset   Colon cancer Father    Diabetes Father     Social History: Social History   Tobacco Use   Smoking status: Never   Smokeless tobacco: Never  Vaping Use   Vaping Use: Never used  Substance Use Topics   Drug use: Never   Social History   Social History Narrative   Never been married    Left Handed   Lives in a two story home alone     Vital Signs:  BP (!) 142/82   Pulse 89    Ht 5\' 11"  (1.803 m)   Wt 167 lb (75.8 kg)   SpO2 97%   BMI 23.29 kg/m   Neurological Exam: MENTAL STATUS including orientation to time, place, person, recent and remote memory, attention span and concentration, language, and fund of knowledge is normal.  Speech is not dysarthric.  CRANIAL NERVES: II:  No visual field defects.    III-IV-VI: Pupils equal round and reactive to light.  Normal conjugate, extra-ocular eye movements in all directions of gaze.  No nystagmus.  No ptosis.   V:  Normal facial sensation.    VII:  Normal facial symmetry and movements.   VIII:  Normal hearing and vestibular function.   IX-X:  Normal palatal movement.   XI:  Normal shoulder shrug and head rotation.   XII:  Normal tongue strength and range of motion, no deviation or fasciculation.  MOTOR:  No atrophy, fasciculations or abnormal movements.  No pronator drift.   Upper Extremity:  Right  Left  Deltoid  5/5   5/5   Biceps  5/5   5/5   Triceps  5/5   5/5   Infraspinatus 5/5  5/5  Medial pectoralis 5/5  5/5  Wrist extensors  5-/5   5/5   Wrist flexors  5/5   5/5   Finger extensors  4/5   5/5   Finger flexors  5/5   5/5   Dorsal interossei  4/5   5/5   Abductor pollicis  5/5   5/5   Tone (Ashworth scale)  0  0   Lower Extremity:  Right  Left  Hip flexors  5/5   5/5   Hip extensors  5/5   5/5   Adductor 5/5  5/5  Abductor 5/5  5/5  Knee flexors  5/5   5/5   Knee extensors  5/5   5/5   Dorsiflexors  5/5   5/5   Plantarflexors  5/5   5/5   Toe extensors  5/5   5/5   Toe flexors  5/5   5/5   Tone (Ashworth scale)  0  0   MSRs:  Right        Left                  brachioradialis 2+  2+  biceps 2+  2+  triceps 2+  2+  patellar 2+  2+  ankle jerk 2+  2+  Hoffman no  no  plantar response down  down   SENSORY:  Reduced temperature and pin prick over the right radial nerve distribution, otherwise sensation intact throughout, including distally in the feet.   COORDINATION/GAIT: Normal  finger-to- nose-finger.  Intact rapid alternating movements bilaterally.  Gait narrow based and stable. Tandem and stressed gait intact.    IMPRESSION: Right radial nerve palsy secondary to falling asleep on the arm, improving.  Exam shows nearly normal wrist extension and mild weakness with finger extension, mild sensory loss over the radial sensory distribution. - NCS/EMG of the right arm to guide prognosis.  I suspect he will make full recovery given his rapid improvement already - Home hand exercises discussed  2.   Chronic bilateral leg pain.  Symptoms are not characteristic of neuropathy or RLS  - NCS/EMG of the right leg to help characterize the nature of his symptoms   Further recommendations pending results.   Thank you for allowing me to participate in patient's care.  If I can answer any additional questions, I would be pleased to do so.    Sincerely,    Laryah Neuser K. Allena Katz, DO

## 2021-01-13 NOTE — Patient Instructions (Signed)
Nerve of the right arm and leg ° °ELECTROMYOGRAM AND NERVE CONDUCTION STUDIES (EMG/NCS) INSTRUCTIONS ° °How to Prepare °The neurologist conducting the EMG will need to know if you have certain medical conditions. Tell the neurologist and other EMG lab personnel if you: °Have a pacemaker or any other electrical medical device °Take blood-thinning medications °Have hemophilia, a blood-clotting disorder that causes prolonged bleeding °Bathing °Take a shower or bath shortly before your exam in order to remove oils from your skin. Don’t apply lotions or creams before the exam.  °What to Expect °You’ll likely be asked to change into a hospital gown for the procedure and lie down on an examination table. The following explanations can help you understand what will happen during the exam.  °Electrodes. The neurologist or a technician places surface electrodes at various locations on your skin depending on where you’re experiencing symptoms. Or the neurologist may insert needle electrodes at different sites depending on your symptoms.  °Sensations. The electrodes will at times transmit a tiny electrical current that you may feel as a twinge or spasm. The needle electrode may cause discomfort or pain that usually ends shortly after the needle is removed. °If you are concerned about discomfort or pain, you may want to talk to the neurologist about taking a short break during the exam.  °Instructions. During the needle EMG, the neurologist will assess whether there is any spontaneous electrical activity when the muscle is at rest - activity that isn’t present in healthy muscle tissue - and the degree of activity when you slightly contract the muscle.  °He or she will give you instructions on resting and contracting a muscle at appropriate times. Depending on what muscles and nerves the neurologist is examining, he or she may ask you to change positions during the exam.  °After your EMG °You may experience some temporary, minor  bruising where the needle electrode was inserted into your muscle. This bruising should fade within several days. If it persists, contact your primary care doctor.  ° °

## 2021-01-13 NOTE — Telephone Encounter (Signed)
His leg pain is not characteristic of nerve pain, and he has not responded to medications that we use such as gabapentin and Lyrica, so I would recommend he follow-up with his PCP.

## 2021-01-13 NOTE — Telephone Encounter (Signed)
Before leaving appotinemtn patient wanted to know if Dr. Allena Katz could provide him with any pain relief? He states he has been taking a lot of Motrin. I informed patient that I would send a message to Dr. Allena Katz because she was busy with another patient.

## 2021-02-02 ENCOUNTER — Ambulatory Visit: Payer: 59 | Admitting: Neurology

## 2021-02-10 ENCOUNTER — Telehealth: Payer: Self-pay

## 2021-02-10 ENCOUNTER — Other Ambulatory Visit: Payer: Self-pay | Admitting: Adult Health

## 2021-02-10 DIAGNOSIS — G4700 Insomnia, unspecified: Secondary | ICD-10-CM

## 2021-02-10 DIAGNOSIS — G629 Polyneuropathy, unspecified: Secondary | ICD-10-CM

## 2021-02-10 NOTE — Telephone Encounter (Signed)
Patient called requesting Rx refill amitriptyline (ELAVIL) 50 MG tablet

## 2021-02-10 NOTE — Telephone Encounter (Signed)
Okay for refill?  

## 2021-02-11 ENCOUNTER — Other Ambulatory Visit: Payer: Self-pay | Admitting: Adult Health

## 2021-02-11 DIAGNOSIS — G4700 Insomnia, unspecified: Secondary | ICD-10-CM

## 2021-02-11 DIAGNOSIS — G629 Polyneuropathy, unspecified: Secondary | ICD-10-CM

## 2021-02-11 MED ORDER — AMITRIPTYLINE HCL 50 MG PO TABS: ORAL_TABLET | ORAL | 0 refills | Status: DC

## 2021-02-11 NOTE — Telephone Encounter (Signed)
Patient notified of update  and verbalized understanding. 

## 2021-02-22 ENCOUNTER — Ambulatory Visit: Payer: 59 | Admitting: Neurology

## 2021-02-22 ENCOUNTER — Other Ambulatory Visit: Payer: Self-pay

## 2021-02-22 DIAGNOSIS — M79604 Pain in right leg: Secondary | ICD-10-CM

## 2021-02-22 DIAGNOSIS — M79605 Pain in left leg: Secondary | ICD-10-CM | POA: Diagnosis not present

## 2021-02-22 DIAGNOSIS — G5631 Lesion of radial nerve, right upper limb: Secondary | ICD-10-CM

## 2021-02-22 NOTE — Procedures (Signed)
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  NCV & EMG Findings: Electrodiagnostic testing was prematurely terminated due to pain.  Findings show normal median sensory response.  Impression: This is an incomplete study, as testing was terminated at patient's request due to pain.   ___________________________ 77, DO    Nerve Conduction Studies Anti Sensory Summary Table   Stim Site NR Peak (ms) Norm Peak (ms) P-T Amp (V) Norm P-T Amp  Right Median Anti Sensory (2nd Digit)  32C  Wrist    3.4 <3.8 12.4 >10      Waveforms:

## 2021-02-22 NOTE — Progress Notes (Signed)
Follow-up Visit   Date: 02/22/21   Dean Martin MRN: [id number] DOB: 1961-03-20   Interim History: Dean Martin is a 60 y.o. left-handed Caucasian male with hyperlipidemia and anxiety returning to the clinic for follow-up of right radial neuropathy.  The patient was accompanied to the clinic by self.  History of present illness: On September 30th, he was sitting in his lazy boy to watch TV and fell asleep leaning on his right arm for about 2 hours.  When he woke up, he had noticed complete right wrist drop.  He called 911 because of concern of stroke. MRI head was negative for stroke. 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  Over the past week, he has regained strength in the right arm, such that he can extend the wrist and fingers.  He continues to have numbness over radial distribution.  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     He also has chronic bilateral severe leg pain, described as a wrenching pain involving the entire lower legs.  No change in symptoms with activity.  This started back when he was 60 years old.  He tells me that in 1988, he was unable to void his urine and developed leg numbness.  He was hospitalized and told that he had single demyelinating event.  He was treated with ACTH for several months.  His MRI brain and cervical spine from last week does not show any demyelinating changes.  For his leg pain, he has tried gabapentin, Lyrica, and currently on amitriptyline.  He gets temporary relief with medications, but nothing lasting.  He has been told he has neuropathy.  He denies numbness/tingling of th legs.  No history of diabetes or heavy alcohol use.   UPDATE 02/22/2021:  He is here for EDX, however was unable to complete testing due to pain.  He tells me that his sensory symptoms over the right hand are slowly improving.  No weakness of the hand,  however, he notices that there is wrist pain when he is using his hands to manage controls for work.  He also continues to have achy/throbbing pain in the thighs which is usually worse in the evening.  He denies numbness/tingling or crawling sensation.  Medications:  Current Outpatient Medications on File Prior to Visit  Medication Sig Dispense Refill   ALPRAZolam (XANAX) 0.25 MG tablet TAKE 1 TABLET BY MOUTH 2 TIMES DAILY AS NEEDED FOR ANXIETY. 60 tablet 2   amitriptyline (ELAVIL) 50 MG tablet TAKE 1 TABLET BY MOUTH EVERYDAY AT BEDTIME 90 tablet 0   atorvastatin (LIPITOR) 20 MG tablet Take 1 tablet (20 mg total) by mouth at bedtime. 90 tablet 3   EPINEPHrine 0.3 mg/0.3 mL IJ SOAJ injection Inject 0.3 mg into the muscle as needed for anaphylaxis. 1 each 1   No current facility-administered medications on file prior to visit.    Allergies: No Known Allergies  Vital Signs:  There were no vitals taken for this visit.   Exam deferred, see note on 01/13/2021  Data: NCS/EMG of the right arm and leg 02/22/2021: This is an incomplete study, as testing was terminated at patient's request due to pain.  IMPRESSION/PLAN: Right radial neuropathy, improving.  He still has mild numbness and difficulty with repetitive hand movements, but overall seeing steady improvement.  He was unable to tolerate EMG today, but it is reassuring he continuing  to see improvement Bilateral leg pain, symptoms are not characteristic of neuropathy, radiculopathy, or RLS.  He describes deep achy pain in the thighs.  He has tried gabapentin, Lyrica, and requip with no benefit.  He currently takes amitriptyline 50mg  at bedtime.  He was unable to complete EMG.  Continue symptom management as per PCP, if no improvement consider seeing pain management.   Thank you for allowing me to participate in patient's care.  If I can answer any additional questions, I would be pleased to do so.    Sincerely,    Anhelica Fowers K. , DO

## 2021-03-13 ENCOUNTER — Other Ambulatory Visit: Payer: Self-pay | Admitting: Adult Health

## 2021-03-13 DIAGNOSIS — G4700 Insomnia, unspecified: Secondary | ICD-10-CM

## 2021-03-13 DIAGNOSIS — F419 Anxiety disorder, unspecified: Secondary | ICD-10-CM

## 2021-03-13 DIAGNOSIS — G629 Polyneuropathy, unspecified: Secondary | ICD-10-CM

## 2021-03-14 NOTE — Telephone Encounter (Signed)
Patient called to clarify that even though pharmacy sent request for three prescriptions patient only needs the ALPRAZolam 0.25 MG     [id number] [place], [place] - [address] AT [facility] [address] [facility] [address]  [address] [place] [address]  Phone:  [phone]  Fax:  [phone]       Please advise

## 2021-03-16 NOTE — Telephone Encounter (Signed)
Okay for refill?  

## 2021-04-16 ENCOUNTER — Other Ambulatory Visit: Payer: Self-pay | Admitting: Adult Health

## 2021-04-16 DIAGNOSIS — F419 Anxiety disorder, unspecified: Secondary | ICD-10-CM

## 2021-04-16 DIAGNOSIS — G629 Polyneuropathy, unspecified: Secondary | ICD-10-CM

## 2021-04-16 DIAGNOSIS — G4700 Insomnia, unspecified: Secondary | ICD-10-CM

## 2021-04-18 ENCOUNTER — Telehealth: Payer: Self-pay | Admitting: Adult Health

## 2021-04-18 NOTE — Telephone Encounter (Signed)
Patient called to get refill on ALPRAZolam (XANAX) 0.25 MG tablet       Please send to  [id number] [place], [place] - [address] AT [facility] [address] Phone:  [phone]  Fax:  [phone]         Please advise

## 2021-04-19 NOTE — Telephone Encounter (Signed)
Tried to call pr to schedule a CPE pt is due. Will send Rx request to [facility] for advise.   Ok to fill?

## 2021-04-20 NOTE — Telephone Encounter (Signed)
Left message to return phone call.

## 2021-04-20 NOTE — Telephone Encounter (Signed)
Noted  

## 2021-04-20 NOTE — Telephone Encounter (Signed)
Okay for refill?  

## 2021-04-20 NOTE — Telephone Encounter (Addendum)
Pt has a cpe sch for 04-26-2021 at 4 pm . Pt works in [employer] he works from 7 am to 3 pm. Pt would like a refill on alprazolam

## 2021-04-22 NOTE — Telephone Encounter (Signed)
Okay for refill?   Pt has been scheduled for refills

## 2021-04-25 NOTE — Telephone Encounter (Signed)
Noted  

## 2021-04-26 ENCOUNTER — Ambulatory Visit (INDEPENDENT_AMBULATORY_CARE_PROVIDER_SITE_OTHER): Payer: 59 | Admitting: Adult Health

## 2021-04-26 ENCOUNTER — Encounter: Payer: Self-pay | Admitting: Adult Health

## 2021-04-26 VITALS — BP 110/70 | HR 71 | Temp 98.0°F | Ht 71.0 in | Wt 170.0 lb

## 2021-04-26 DIAGNOSIS — M79604 Pain in right leg: Secondary | ICD-10-CM

## 2021-04-26 DIAGNOSIS — F419 Anxiety disorder, unspecified: Secondary | ICD-10-CM

## 2021-04-26 DIAGNOSIS — Z0000 Encounter for general adult medical examination without abnormal findings: Secondary | ICD-10-CM

## 2021-04-26 DIAGNOSIS — Z114 Encounter for screening for human immunodeficiency virus [HIV]: Secondary | ICD-10-CM

## 2021-04-26 DIAGNOSIS — G4700 Insomnia, unspecified: Secondary | ICD-10-CM

## 2021-04-26 DIAGNOSIS — Z1159 Encounter for screening for other viral diseases: Secondary | ICD-10-CM

## 2021-04-26 DIAGNOSIS — M79605 Pain in left leg: Secondary | ICD-10-CM

## 2021-04-26 DIAGNOSIS — Z23 Encounter for immunization: Secondary | ICD-10-CM

## 2021-04-26 DIAGNOSIS — E782 Mixed hyperlipidemia: Secondary | ICD-10-CM | POA: Diagnosis not present

## 2021-04-26 DIAGNOSIS — Z125 Encounter for screening for malignant neoplasm of prostate: Secondary | ICD-10-CM

## 2021-04-26 MED ORDER — TRAZODONE HCL 50 MG PO TABS: 50.0000 mg | ORAL_TABLET | Freq: Every evening | ORAL | 3 refills | Status: DC | PRN

## 2021-04-26 MED ORDER — ALPRAZOLAM 0.25 MG PO TABS: ORAL_TABLET | ORAL | 1 refills | Status: DC

## 2021-04-26 NOTE — Patient Instructions (Signed)
It was great seeing you today!  Please schedule your follow up for labs within the month   You are due for your second pneumonia vaccination in 3 month   I am going to send in a prescription for Trazodone to take instead of xanax to help you sleep   We will follow up with you regarding your lab work   Please let me know if you need anything

## 2021-04-26 NOTE — Progress Notes (Signed)
Subjective:    Patient ID: Dean Martin, male    DOB: 06-Dec-1960, 61 y.o.   MRN: [id number]  HPI Patient presents for yearly preventative medicine examination. He is a pleasant 61 year old male who  has a past medical history of Anxiety.  Insomnia/Anxiety -controlled with Xanax 0.25 mg twice daily  Bilateral lower extremity leg pain - has tried gabapentin, Lyrica and Requip in the past without success.  His neuropathy seems to be worse at night.  Currently prescribed Elavil 50 mg nightly, which she gets temporary relief with.  he was seen by neurology recently, tried to do a nerve conduction study but he was unable to complete due to pain.  Neurology does not think his symptoms are consistent with neuropathy.   Hyperlipidemia - takes Lipitor 20 mg QHS. Denies myalgia or fatigue  Lab Results  Component Value Date   CHOL 317 (H) 04/16/2020   HDL 68.40 04/16/2020   LDLCALC 234 (H) 04/16/2020   TRIG 72.0 04/16/2020   CHOLHDL 5 04/16/2020    All immunizations and health maintenance protocols were reviewed with the patient and needed orders were placed.  Appropriate screening laboratory values were ordered for the patient including screening of hyperlipidemia, renal function and hepatic function. If indicated by BPH, a PSA was ordered.  Medication reconciliation,  past medical history, social history, problem list and allergies were reviewed in detail with the patient  Goals were established with regard to weight loss, exercise, and  diet in compliance with medications Wt Readings from Last 3 Encounters:  04/26/21 170 lb (77.1 kg)  01/13/21 167 lb (75.8 kg)  01/08/21 164 lb 0.4 oz (74.4 kg)   He is up-to-date on routine colon cancer screening.   Review of Systems  Constitutional: Negative.   HENT: Negative.    Eyes: Negative.   Respiratory: Negative.    Cardiovascular: Negative.   Gastrointestinal: Negative.   Endocrine: Negative.   Genitourinary: Negative.    Musculoskeletal:  Positive for arthralgias.  Skin: Negative.   Allergic/Immunologic: Negative.   Neurological: Negative.   Hematological: Negative.   Psychiatric/Behavioral: Negative.    All other systems reviewed and are negative. Past Medical History:  Diagnosis Date   Anxiety     Social History   Socioeconomic History   Marital status: Single    Spouse name: Not on file   Number of children: 0   Years of education: Not on file   Highest education level: Not on file  Occupational History   Not on file  Tobacco Use   Smoking status: Never   Smokeless tobacco: Never  Vaping Use   Vaping Use: Never used  Substance and Sexual Activity   Alcohol use: Not on file    Comment: OCCASIONAL   Drug use: Never   Sexual activity: Yes    Partners: Female  Other Topics Concern   Not on file  Social History Narrative   Never been married    Left Handed   Lives in a two story home alone    Social Determinants of Health   Financial Resource Strain: Not on file  Food Insecurity: Not on file  Transportation Needs: Not on file  Physical Activity: Not on file  Stress: Not on file  Social Connections: Not on file  Intimate Partner Violence: Not on file    History reviewed. No pertinent surgical history.  Family History  Problem Relation Age of Onset   Colon cancer Father    Diabetes Father  No Known Allergies  Current Outpatient Medications on File Prior to Visit  Medication Sig Dispense Refill   amitriptyline (ELAVIL) 50 MG tablet TAKE 1 TABLET BY MOUTH EVERYDAY AT BEDTIME 30 tablet 0   atorvastatin (LIPITOR) 20 MG tablet TAKE 1 TABLET BY MOUTH EVERYDAY AT BEDTIME 30 tablet 0   EPINEPHrine 0.3 mg/0.3 mL IJ SOAJ injection Inject 0.3 mg into the muscle as needed for anaphylaxis. 1 each 1   No current facility-administered medications on file prior to visit.    BP 110/70    Pulse 71    Temp 98 F (36.7 C) (Oral)    Ht 5\' 11"  (1.803 m)    Wt 170 lb (77.1 kg)    SpO2  97%    BMI 23.71 kg/m       Objective:   Physical Exam Vitals and nursing note reviewed.  Constitutional:      General: He is not in acute distress.    Appearance: Normal appearance. He is well-developed and normal weight.  HENT:     Head: Normocephalic and atraumatic.     Right Ear: Tympanic membrane, ear canal and external ear normal. There is no impacted cerumen.     Left Ear: Tympanic membrane, ear canal and external ear normal. There is no impacted cerumen.     Nose: Nose normal. No congestion or rhinorrhea.     Mouth/Throat:     Mouth: Mucous membranes are moist.     Pharynx: Oropharynx is clear. No oropharyngeal exudate or posterior oropharyngeal erythema.  Eyes:     General:        Right eye: No discharge.        Left eye: No discharge.     Extraocular Movements: Extraocular movements intact.     Conjunctiva/sclera: Conjunctivae normal.     Pupils: Pupils are equal, round, and reactive to light.  Neck:     Vascular: No carotid bruit.     Trachea: No tracheal deviation.  Cardiovascular:     Rate and Rhythm: Normal rate and regular rhythm.     Pulses: Normal pulses.     Heart sounds: Normal heart sounds. No murmur heard.   No friction rub. No gallop.  Pulmonary:     Effort: Pulmonary effort is normal. No respiratory distress.     Breath sounds: Normal breath sounds. No stridor. No wheezing, rhonchi or rales.  Chest:     Chest wall: No tenderness.  Abdominal:     General: Bowel sounds are normal. There is no distension.     Palpations: Abdomen is soft. There is no mass.     Tenderness: There is no abdominal tenderness. There is no right CVA tenderness, left CVA tenderness, guarding or rebound.     Hernia: No hernia is present.  Musculoskeletal:        General: No swelling, tenderness, deformity or signs of injury. Normal range of motion.     Right lower leg: No edema.     Left lower leg: No edema.  Lymphadenopathy:     Cervical: No cervical adenopathy.  Skin:     General: Skin is warm and dry.     Capillary Refill: Capillary refill takes less than 2 seconds.     Coloration: Skin is not jaundiced or pale.     Findings: No bruising, erythema, lesion or rash.  Neurological:     General: No focal deficit present.     Mental Status: He is alert and oriented to person, place, and  time.     Cranial Nerves: No cranial nerve deficit.     Sensory: No sensory deficit.     Motor: No weakness.     Coordination: Coordination normal.     Gait: Gait normal.     Deep Tendon Reflexes: Reflexes normal.  Psychiatric:        Mood and Affect: Mood normal.        Behavior: Behavior normal.        Thought Content: Thought content normal.        Judgment: Judgment normal.      Assessment & Plan:  1. Routine general medical examination at a health care facility - Benign exam  - Follow up in one year or sooner if needed - CBC with Differential/Platelet; Future - Comprehensive metabolic panel; Future - Lipid panel; Future - TSH; Future - Hep C Antibody; Future - HIV Antibody (routine testing w rflx); Future 2. Mixed hyperlipidemia - Consider increase in statin  - CBC with Differential/Platelet; Future - Comprehensive metabolic panel; Future - Lipid panel; Future - TSH; Future  3. Bilateral leg pain - Discussed MRI of low back. He does not want to do that at this time   4. Insomnia, unspecified type - He would like to start weening off Xanax. Will send in trazodone to take at night. Advised not to take trazodone with xanax.  - Follow up if this does not work for him  - ALPRAZolam (XANAX) 0.25 MG tablet; Take twice daily as needed for anxiety  Dispense: 60 tablet; Refill: 1 - traZODone (DESYREL) 50 MG tablet; Take 1 tablet (50 mg total) by mouth at bedtime as needed for sleep.  Dispense: 30 tablet; Refill: 3  5. Anxiety  - ALPRAZolam (XANAX) 0.25 MG tablet; Take twice daily as needed for anxiety  Dispense: 60 tablet; Refill: 1 - traZODone (DESYREL) 50 MG  tablet; Take 1 tablet (50 mg total) by mouth at bedtime as needed for sleep.  Dispense: 30 tablet; Refill: 3  6. Encounter for screening for HIV  - HIV Antibody (routine testing w rflx); Future  7. Need for hepatitis C screening test  - Hep C Antibody; Future  8. Prostate cancer screening  - PSA; Future  9. Need for shingles vaccine  - Varicella-zoster vaccine IM  Shirline Freesory Ayman Brull, NP

## 2021-04-29 ENCOUNTER — Other Ambulatory Visit: Payer: 59

## 2021-05-03 ENCOUNTER — Other Ambulatory Visit (INDEPENDENT_AMBULATORY_CARE_PROVIDER_SITE_OTHER): Payer: 59

## 2021-05-03 DIAGNOSIS — Z0000 Encounter for general adult medical examination without abnormal findings: Secondary | ICD-10-CM | POA: Diagnosis not present

## 2021-05-03 DIAGNOSIS — Z125 Encounter for screening for malignant neoplasm of prostate: Secondary | ICD-10-CM

## 2021-05-03 DIAGNOSIS — Z114 Encounter for screening for human immunodeficiency virus [HIV]: Secondary | ICD-10-CM

## 2021-05-03 DIAGNOSIS — Z1159 Encounter for screening for other viral diseases: Secondary | ICD-10-CM

## 2021-05-03 DIAGNOSIS — E782 Mixed hyperlipidemia: Secondary | ICD-10-CM | POA: Diagnosis not present

## 2021-05-03 LAB — CBC WITH DIFFERENTIAL/PLATELET
Basophils Absolute: 0.1 10*3/uL (ref 0.0–0.1)
Basophils Relative: 0.9 % (ref 0.0–3.0)
Eosinophils Absolute: 0.2 10*3/uL (ref 0.0–0.7)
Eosinophils Relative: 3.5 % (ref 0.0–5.0)
HCT: 46.4 % (ref 39.0–52.0)
Hemoglobin: 15.4 g/dL (ref 13.0–17.0)
Lymphocytes Relative: 22.6 % (ref 12.0–46.0)
Lymphs Abs: 1.4 10*3/uL (ref 0.7–4.0)
MCHC: 33.2 g/dL (ref 30.0–36.0)
MCV: 88.8 fl (ref 78.0–100.0)
Monocytes Absolute: 0.7 10*3/uL (ref 0.1–1.0)
Monocytes Relative: 11.5 % (ref 3.0–12.0)
Neutro Abs: 3.9 10*3/uL (ref 1.4–7.7)
Neutrophils Relative %: 61.5 % (ref 43.0–77.0)
Platelets: 256 10*3/uL (ref 150.0–400.0)
RBC: 5.23 Mil/uL (ref 4.22–5.81)
RDW: 13.3 % (ref 11.5–15.5)
WBC: 6.4 10*3/uL (ref 4.0–10.5)

## 2021-05-03 LAB — COMPREHENSIVE METABOLIC PANEL
ALT: 15 U/L (ref 0–53)
AST: 15 U/L (ref 0–37)
Albumin: 4.4 g/dL (ref 3.5–5.2)
Alkaline Phosphatase: 45 U/L (ref 39–117)
BUN: 23 mg/dL (ref 6–23)
CO2: 27 mEq/L (ref 19–32)
Calcium: 9.8 mg/dL (ref 8.4–10.5)
Chloride: 103 mEq/L (ref 96–112)
Creatinine, Ser: 1.01 mg/dL (ref 0.40–1.50)
GFR: 79.96 mL/min (ref >60.00)
Glucose, Bld: 102 mg/dL — ABNORMAL HIGH (ref 70–99)
Potassium: 4.6 mEq/L (ref 3.5–5.1)
Sodium: 138 mEq/L (ref 135–145)
Total Bilirubin: 1.2 mg/dL (ref 0.2–1.2)
Total Protein: 6.9 g/dL (ref 6.0–8.3)

## 2021-05-03 LAB — TSH: TSH: 1.12 u[IU]/mL (ref 0.35–5.50)

## 2021-05-03 LAB — LIPID PANEL
Cholesterol: 196 mg/dL (ref 0–200)
HDL: 63.6 mg/dL (ref >39.00)
LDL Cholesterol: 119 mg/dL — ABNORMAL HIGH (ref 0–99)
NonHDL: 132.01
Total CHOL/HDL Ratio: 3
Triglycerides: 64 mg/dL (ref 0.0–149.0)
VLDL: 12.8 mg/dL (ref 0.0–40.0)

## 2021-05-03 LAB — PSA: PSA: 0.67 ng/mL (ref 0.10–4.00)

## 2021-05-04 LAB — HEPATITIS C ANTIBODY
Hepatitis C Ab: NONREACTIVE
SIGNAL TO CUT-OFF: 0.15 (ref <1.00)

## 2021-05-04 LAB — HIV ANTIBODY (ROUTINE TESTING W REFLEX): HIV 1&2 Ab, 4th Generation: NONREACTIVE

## 2021-05-13 ENCOUNTER — Other Ambulatory Visit: Payer: Self-pay | Admitting: Adult Health

## 2021-05-13 DIAGNOSIS — G629 Polyneuropathy, unspecified: Secondary | ICD-10-CM

## 2021-05-13 DIAGNOSIS — G4700 Insomnia, unspecified: Secondary | ICD-10-CM

## 2021-05-22 ENCOUNTER — Other Ambulatory Visit: Payer: Self-pay | Admitting: Adult Health

## 2021-05-22 DIAGNOSIS — F419 Anxiety disorder, unspecified: Secondary | ICD-10-CM

## 2021-05-22 DIAGNOSIS — G4700 Insomnia, unspecified: Secondary | ICD-10-CM

## 2021-06-14 ENCOUNTER — Other Ambulatory Visit: Payer: Self-pay | Admitting: Adult Health

## 2021-07-07 ENCOUNTER — Telehealth: Payer: Self-pay | Admitting: Adult Health

## 2021-07-07 DIAGNOSIS — G629 Polyneuropathy, unspecified: Secondary | ICD-10-CM

## 2021-07-07 DIAGNOSIS — G4700 Insomnia, unspecified: Secondary | ICD-10-CM

## 2021-07-07 MED ORDER — AMITRIPTYLINE HCL 50 MG PO TABS: ORAL_TABLET | ORAL | 1 refills | Status: DC

## 2021-07-07 MED ORDER — ATORVASTATIN CALCIUM 40 MG PO TABS: 40.0000 mg | ORAL_TABLET | Freq: Every day | ORAL | 2 refills | Status: DC

## 2021-07-07 NOTE — Telephone Encounter (Signed)
Atorvastatin 20mg  BID changed to 40mg  daily  and amitriptyline send in to pharmacy. Pt notified to take 1 tab of atorvastatin now. No further action needed.  ?

## 2021-07-07 NOTE — Telephone Encounter (Signed)
Patient called in requesting a medication refill for atorvastatin (LIPITOR) 20 MG tablet [[id number]]  and amitriptyline (ELAVIL) 50 MG tablet [[id number]]  to be sent to his pharmacy. ? ?Please advise. ?

## 2021-07-10 ENCOUNTER — Other Ambulatory Visit: Payer: Self-pay | Admitting: Adult Health

## 2021-07-18 ENCOUNTER — Other Ambulatory Visit: Payer: Self-pay | Admitting: Adult Health

## 2021-07-18 DIAGNOSIS — F419 Anxiety disorder, unspecified: Secondary | ICD-10-CM

## 2021-07-18 DIAGNOSIS — G4700 Insomnia, unspecified: Secondary | ICD-10-CM

## 2021-07-20 NOTE — Telephone Encounter (Signed)
From OV on 04/26/21: ?4. Insomnia, unspecified type ?- He would like to start weening off Xanax. Will send in trazodone to take at night. Advised not to take trazodone with xanax.  ?- Follow up if this does not work for him  ?- ALPRAZolam (XANAX) 0.25 MG tablet; Take twice daily as needed for anxiety  Dispense: 60 tablet; Refill: 1 ? ?Last refill per controlled substance database: 06/14/21 ?Last OV: 04/26/21 ?Next OV: none scheduled ?

## 2021-07-21 NOTE — Telephone Encounter (Signed)
Pt is calling and does not want to wean off the alprazolam pt would like a refill send to . Pt stated he talk with cory at last visit ?[id number] [place] [place] Phone:  [phone]  ?Fax:  [phone]  ?  ? ?

## 2021-08-30 ENCOUNTER — Ambulatory Visit (INDEPENDENT_AMBULATORY_CARE_PROVIDER_SITE_OTHER): Payer: 59 | Admitting: Adult Health

## 2021-08-30 ENCOUNTER — Encounter: Payer: Self-pay | Admitting: Adult Health

## 2021-08-30 VITALS — BP 110/78 | HR 80 | Temp 98.5°F | Ht 71.0 in | Wt 170.0 lb

## 2021-08-30 DIAGNOSIS — M79652 Pain in left thigh: Secondary | ICD-10-CM | POA: Diagnosis not present

## 2021-08-30 DIAGNOSIS — M779 Enthesopathy, unspecified: Secondary | ICD-10-CM | POA: Diagnosis not present

## 2021-08-30 MED ORDER — PREDNISONE 10 MG PO TABS: ORAL_TABLET | ORAL | 0 refills | Status: DC

## 2021-08-30 NOTE — Progress Notes (Signed)
Subjective:    Patient ID: DEMTRIUS Martin, male    DOB: June 05, 1960, 61 y.o.   MRN: [id number]  HPI  61 year old male who  has a past medical history of Anxiety.  He presents to the office today for 2 separate acute issues.  His first issue is that of pain in the middle of his left thigh.  This pain has been "coming and going over the past 10 days, but has improved over the last 2 days.  Pain is described as "wincing pain" and only lasts for about 10 seconds at a time.  It can occur multiple times a day.  There is no rhyme or reason on why it presents.  He has not noticed any lower extremity edema, redness, warmth, or bruising.  No known trauma or aggravating injury.  Currently, he reports discomfort in his left forearm from overuse injury.  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  This discomfort has been present for 2 weeks seems to be getting a little bit better as time goes on.  Pain is only with certain movements such as using a screwdriver or wrench.  Review of Systems See HPI   Past Medical History:  Diagnosis Date   Anxiety     Social History   Socioeconomic History   Marital status: Single    Spouse name: Not on file   Number of children: 0   Years of education: Not on file   Highest education level: Not on file  Occupational History   Not on file  Tobacco Use   Smoking status: Never   Smokeless tobacco: Never  Vaping Use   Vaping Use: Never used  Substance and Sexual Activity   Alcohol use: Not on file    Comment: OCCASIONAL   Drug use: Never   Sexual activity: Yes    Partners: Female  Other Topics Concern   Not on file  Social History Narrative   Never been married    Left Handed   Lives in a two story home alone    Social Determinants of Health   Financial Resource Strain: Not on file  Food Insecurity: Not on file  Transportation Needs: Not on file  Physical Activity: Not on file  Stress: Not on file  Social Connections:  Not on file  Intimate Partner Violence: Not on file    History reviewed. No pertinent surgical history.  Family History  Problem Relation Age of Onset   Colon cancer Father    Diabetes Father     No Known Allergies  Current Outpatient Medications on File Prior to Visit  Medication Sig Dispense Refill   ALPRAZolam (XANAX) 0.25 MG tablet TAKE TWICE DAILY AS NEEDED FOR ANXIETY 60 tablet 2   amitriptyline (ELAVIL) 50 MG tablet TAKE 1 TABLET BY MOUTH EVERYDAY AT BEDTIME 90 tablet 1   atorvastatin (LIPITOR) 40 MG tablet Take 1 tablet (40 mg total) by mouth daily. 90 tablet 2   EPINEPHrine 0.3 mg/0.3 mL IJ SOAJ injection Inject 0.3 mg into the muscle as needed for anaphylaxis. 1 each 1   traZODone (DESYREL) 50 MG tablet TAKE 1 TABLET BY MOUTH AT BEDTIME AS NEEDED FOR SLEEP. 90 tablet 2   No current facility-administered medications on file prior to visit.    BP 110/78   Pulse 80   Temp 98.5 F (36.9 C) (Oral)   Ht 5\' 11"  (1.803 m)   Wt 170 lb (77.1 kg)  SpO2 98%   BMI 23.71 kg/m       Objective:   Physical Exam Vitals and nursing note reviewed.  Constitutional:      Appearance: Normal appearance.  Cardiovascular:     Rate and Rhythm: Normal rate and regular rhythm.     Pulses: Normal pulses.     Heart sounds: Normal heart sounds.  Pulmonary:     Effort: Pulmonary effort is normal.     Breath sounds: Normal breath sounds.  Musculoskeletal:        General: Tenderness present.     Left forearm: Tenderness present. No swelling, deformity or bony tenderness.     Right lower leg: No edema.     Left lower leg: No edema.       Legs:     Comments: Pain throughout the forearm on the flexor tendons.  Skin:    General: Skin is warm and dry.  Neurological:     General: No focal deficit present.     Mental Status: He is alert and oriented to person, place, and time.  Psychiatric:        Mood and Affect: Mood normal.        Behavior: Behavior normal.        Thought  Content: Thought content normal.        Judgment: Judgment normal.      Assessment & Plan:  1. Tendonitis  - predniSONE (DELTASONE) 10 MG tablet; 40 mg x 3 days, 20 mg x 3 days, 10 mg x 3 days  Dispense: 21 tablet; Refill: 0  2. Left thigh pain -Advised stretching and anti-inflammatory medications as needed.  Prednisone may help with this as well.  If no improvement in the next week or so then we will have him stop his statin for roughly 2 weeks.  He will follow-up with me via MyChart.  Shirline Frees, NP

## 2021-09-02 ENCOUNTER — Other Ambulatory Visit: Payer: Self-pay | Admitting: Adult Health

## 2021-09-02 DIAGNOSIS — G4700 Insomnia, unspecified: Secondary | ICD-10-CM

## 2021-09-02 DIAGNOSIS — F419 Anxiety disorder, unspecified: Secondary | ICD-10-CM

## 2021-09-02 NOTE — Telephone Encounter (Signed)
Pt is requesting refill of ALPRAZolam (XANAX) 0.25 MG tablet   [facility] [address] [id number]   the original pharmacy does not have it but this one has .5

## 2021-09-06 NOTE — Telephone Encounter (Signed)
The original prescription for ALPRAZolam (XANAX) 0.25 MG tablet cannot be filled so patient is requesting the dosage be .5 which [facility] [address] They do have the .25 so please write for .5

## 2021-09-07 NOTE — Telephone Encounter (Signed)
Pt call and stated he have been waiting 5 day and want something done.

## 2021-09-08 ENCOUNTER — Other Ambulatory Visit: Payer: Self-pay | Admitting: Adult Health

## 2021-09-08 MED ORDER — ALPRAZOLAM 0.5 MG PO TABS: 0.2500 mg | ORAL_TABLET | Freq: Two times a day (BID) | ORAL | 1 refills | Status: AC

## 2021-09-09 NOTE — Telephone Encounter (Signed)
Rx refilled to [facility] [address]

## 2021-11-09 ENCOUNTER — Telehealth: Payer: Self-pay | Admitting: Adult Health

## 2021-11-09 NOTE — Telephone Encounter (Signed)
Pt still has refills

## 2021-11-09 NOTE — Telephone Encounter (Signed)
Pt is calling and needs a refill on alprazolam 0.25 mg  [id number] - [place], [place] - [address] AT [facility] [address] Phone:  [phone]  Fax:  [phone]

## 2021-11-10 NOTE — Telephone Encounter (Signed)
Patient notified of update  and verbalized understanding. 

## 2021-11-19 ENCOUNTER — Other Ambulatory Visit: Payer: Self-pay | Admitting: Adult Health

## 2021-11-19 DIAGNOSIS — G4700 Insomnia, unspecified: Secondary | ICD-10-CM

## 2021-11-19 DIAGNOSIS — G629 Polyneuropathy, unspecified: Secondary | ICD-10-CM

## 2021-12-14 ENCOUNTER — Telehealth: Payer: Self-pay | Admitting: Adult Health

## 2021-12-14 NOTE — Telephone Encounter (Signed)
Pt is calling and would like a refill on alprazolam 0.5 mg   [id number] - [place], [place] - [address] AT [facility] [address] Phone:  [phone]  Fax:  [phone]

## 2021-12-15 NOTE — Telephone Encounter (Signed)
This medication was switched to trazodone. Called pt to get clarification but no answer. Lm for a return.

## 2021-12-22 NOTE — Telephone Encounter (Signed)
Left message to return phone call.

## 2021-12-23 ENCOUNTER — Telehealth: Payer: Self-pay | Admitting: Adult Health

## 2021-12-23 NOTE — Telephone Encounter (Signed)
Left detailed message.   

## 2021-12-23 NOTE — Telephone Encounter (Signed)
Pt called, returning CMA's call. CMA was unavailable. Pt asked that CMA call back at her earliest convenience.  Pt stated it was ok to leave a detailed message.

## 2021-12-26 NOTE — Telephone Encounter (Signed)
Pt states he no longer takes trazodone and need alprazolam. Pt is aware cory not in office

## 2021-12-27 ENCOUNTER — Other Ambulatory Visit: Payer: Self-pay | Admitting: Adult Health

## 2021-12-27 MED ORDER — ALPRAZOLAM 0.25 MG PO TABS: 0.2500 mg | ORAL_TABLET | Freq: Two times a day (BID) | ORAL | 0 refills | Status: DC | PRN

## 2021-12-27 NOTE — Telephone Encounter (Signed)
Please advise 

## 2021-12-27 NOTE — Telephone Encounter (Signed)
Tried to call pt no answer

## 2022-04-12 ENCOUNTER — Telehealth: Payer: Self-pay

## 2022-04-12 ENCOUNTER — Ambulatory Visit: Payer: 59 | Admitting: Family Medicine

## 2022-04-12 NOTE — Telephone Encounter (Signed)
--  Caller states he has a headache, fever, sweats, and chills. He can't eat but is keeping fluids down. He vomited twice Sat. night.  04/12/2022 8:16:20 AM See PCP within 24 Hours Lovelace, RN, Amy  Referrals REFERRED TO PCP OFFICE  04/12/22 at 0955 - pt states he's at [facility] on [address]. Instructed to let us know if he needs anything additional. Pt verb understanding & has no further needs from office.

## 2022-05-15 ENCOUNTER — Encounter: Payer: Self-pay | Admitting: Family Medicine

## 2022-05-15 ENCOUNTER — Encounter: Payer: Self-pay | Admitting: Adult Health

## 2022-05-15 ENCOUNTER — Telehealth (INDEPENDENT_AMBULATORY_CARE_PROVIDER_SITE_OTHER): Payer: 59 | Admitting: Family Medicine

## 2022-05-15 VITALS — Ht 71.0 in

## 2022-05-15 DIAGNOSIS — R519 Headache, unspecified: Secondary | ICD-10-CM | POA: Diagnosis not present

## 2022-05-15 DIAGNOSIS — J069 Acute upper respiratory infection, unspecified: Secondary | ICD-10-CM

## 2022-05-15 DIAGNOSIS — G4700 Insomnia, unspecified: Secondary | ICD-10-CM

## 2022-05-15 DIAGNOSIS — F419 Anxiety disorder, unspecified: Secondary | ICD-10-CM

## 2022-05-15 MED ORDER — FLUTICASONE PROPIONATE 50 MCG/ACT NA SUSP: 1.0000 | Freq: Two times a day (BID) | NASAL | 3 refills | Status: DC

## 2022-05-15 NOTE — Progress Notes (Signed)
Virtual Visit via Video Note I connected with Dean Martin on 05/15/22 by a video enabled telemedicine application and verified that I am speaking with the correct person using two identifiers. Location patient: home Location provider:work office Persons participating in the virtual visit: patient, provider  I discussed the limitations of evaluation and management by telemedicine and the availability of in person appointments. The patient expressed understanding and agreed to proceed.  Chief Complaint  Patient presents with   Headache    X 10 days, stuffy nose, aches & pains.   HPI: [name] is a 62 yo male with PMHx significant for anxiety and HLD c/o above symptoms. Bitemporal headache, nasal congestion, and aches and pains started about 10 days ago while he was in [place] for a two-week training course, during which symptoms were severe. Since he has been back to [place] he believes he is 80% better. He thinks he recovered from a sinus infection. He has been self-medicating with Afrin, ibuprofen 800 mg, and Sudafed since returning from [place] last Friday.  He reports that his nasal discharge is clear and that his nose is "raw and red."  Mild nonproductive cough.  Headache is not associated with visual changes, nausea, vomiting, or focal weakness. Negative for sore throat, CP, dyspnea, wheezing, abdominal pain, changes in bowel habits, urinary symptoms, or skin rash.  He mentions that had previously visited [facility] on 04/12/22, Dx;ed with viral URI and at that time he was negative for COVID 19 infection.  Symptoms resolved.  ROS: See pertinent positives and negatives per HPI.  Past Medical History:  Diagnosis Date   Anxiety    History reviewed. No pertinent surgical history.  Family History  Problem Relation Age of Onset   Colon cancer Father    Diabetes Father    Social History   Socioeconomic History   Marital status: Single    Spouse name: Not on file    Number of children: 0   Years of education: Not on file   Highest education level: Not on file  Occupational History   Not on file  Tobacco Use   Smoking status: Never   Smokeless tobacco: Never  Vaping Use   Vaping Use: Never used  Substance and Sexual Activity   Alcohol use: Not on file    Comment: OCCASIONAL   Drug use: Never   Sexual activity: Yes    Partners: Female  Other Topics Concern   Not on file  Social History Narrative   Never been married    Left Handed   Lives in a two story home alone    Social Determinants of Health   Financial Resource Strain: Not on file  Food Insecurity: Not on file  Transportation Needs: Not on file  Physical Activity: Not on file  Stress: Not on file  Social Connections: Not on file  Intimate Partner Violence: Not on file    Current Outpatient Medications:    ALPRAZolam (XANAX) 0.25 MG tablet, Take 1 tablet (0.25 mg total) by mouth 2 (two) times daily as needed for anxiety., Disp: 60 tablet, Rfl: 0   amitriptyline (ELAVIL) 50 MG tablet, TAKE 1 TABLET BY MOUTH EVERYDAY AT BEDTIME, Disp: 90 tablet, Rfl: 0   atorvastatin (LIPITOR) 40 MG tablet, Take 1 tablet (40 mg total) by mouth daily., Disp: 90 tablet, Rfl: 2   EPINEPHrine 0.3 mg/0.3 mL IJ SOAJ injection, Inject 0.3 mg into the muscle as needed for anaphylaxis., Disp: 1 each, Rfl: 1  EXAM:  VITALS per patient if applicable:Ht 5\' 11"  (1.803 m)   BMI 23.71 kg/m   GENERAL: alert, oriented, appears well and in no acute distress  HEENT: atraumatic, conjunctiva clear, no obvious abnormalities on inspection of external nose and ears  NECK: normal movements of the head and neck  LUNGS: on inspection no signs of respiratory distress, breathing rate appears normal, no obvious gross SOB, gasping or wheezing  CV: no obvious cyanosis  MS: moves all visible extremities without noticeable abnormality  PSYCH/NEURO: pleasant and cooperative, no obvious depression or anxiety, speech and  thought processing grossly intact  ASSESSMENT AND PLAN:  Discussed the following assessment and plan: URI, acute Symptoms suggests a viral etiology,most symptoms have improved. Still with residual nasal congestion. I do not think abx is needed at this time. Recommend avoiding nasal Afrin. Flonase nasal spray 1 to 2 sprays in nose at bedtime for 10 to 14 days recommended. Nasal saline irrigations as needed. Instructed to monitor for signs of complications, including new onset of fever among some, instructed about warning signs. F/U as needed.  Headache, unspecified headache type We discussed possible etiologies. History does not suggest a serious process. Could be residual after viral illness, allergies among some. I do not think imaging or blood worse are needed at this time. Instructed about warning signs. Flonase nasal spray may help.  Other orders -     Fluticasone Propionate; Place 1 spray into both nostrils 2 (two) times daily.  Dispense: 16 g; Refill: 3  We discussed possible serious and likely etiologies, options for evaluation and workup, limitations of telemedicine visit vs in person visit, treatment, treatment risks and precautions. The patient was advised to call back or seek an in-person evaluation if the symptoms worsen or if the condition fails to improve as anticipated. I discussed the assessment and treatment plan with the patient. The patient was provided an opportunity to ask questions and all were answered. The patient agreed with the plan and demonstrated an understanding of the instructions.  Return if symptoms worsen or fail to improve.  Dean Honaker G. Martinique, MD  [facility]. [place] office.

## 2022-05-16 MED ORDER — TRAZODONE HCL 50 MG PO TABS: 50.0000 mg | ORAL_TABLET | Freq: Every evening | ORAL | 0 refills | Status: DC | PRN

## 2022-05-18 ENCOUNTER — Other Ambulatory Visit: Payer: Self-pay | Admitting: Adult Health

## 2022-06-08 ENCOUNTER — Other Ambulatory Visit: Payer: Self-pay | Admitting: Adult Health

## 2022-06-08 DIAGNOSIS — G4700 Insomnia, unspecified: Secondary | ICD-10-CM

## 2022-06-08 DIAGNOSIS — G629 Polyneuropathy, unspecified: Secondary | ICD-10-CM

## 2022-06-09 ENCOUNTER — Other Ambulatory Visit: Payer: Self-pay | Admitting: Adult Health

## 2022-06-09 DIAGNOSIS — F419 Anxiety disorder, unspecified: Secondary | ICD-10-CM

## 2022-06-09 DIAGNOSIS — G4700 Insomnia, unspecified: Secondary | ICD-10-CM

## 2022-06-13 NOTE — Telephone Encounter (Signed)
Pt needs to schedule a CPE for further refills

## 2022-07-05 ENCOUNTER — Other Ambulatory Visit: Payer: Self-pay | Admitting: Adult Health

## 2022-07-05 DIAGNOSIS — G4700 Insomnia, unspecified: Secondary | ICD-10-CM

## 2022-07-05 DIAGNOSIS — G629 Polyneuropathy, unspecified: Secondary | ICD-10-CM

## 2022-08-13 ENCOUNTER — Other Ambulatory Visit: Payer: Self-pay | Admitting: Family Medicine

## 2022-10-26 ENCOUNTER — Other Ambulatory Visit: Payer: Self-pay | Admitting: Adult Health

## 2022-10-31 ENCOUNTER — Other Ambulatory Visit: Payer: Self-pay | Admitting: Adult Health

## 2022-10-31 DIAGNOSIS — F419 Anxiety disorder, unspecified: Secondary | ICD-10-CM

## 2022-10-31 DIAGNOSIS — G4700 Insomnia, unspecified: Secondary | ICD-10-CM

## 2023-02-25 ENCOUNTER — Other Ambulatory Visit: Payer: Self-pay | Admitting: Adult Health

## 2023-04-17 ENCOUNTER — Other Ambulatory Visit: Payer: 59

## 2023-05-11 ENCOUNTER — Ambulatory Visit: Payer: 59 | Admitting: Adult Health

## 2023-05-11 ENCOUNTER — Encounter: Payer: Self-pay | Admitting: Adult Health

## 2023-05-11 VITALS — BP 100/80 | HR 81 | Temp 98.1°F | Ht 69.5 in | Wt 165.0 lb

## 2023-05-11 DIAGNOSIS — G4700 Insomnia, unspecified: Secondary | ICD-10-CM | POA: Diagnosis not present

## 2023-05-11 DIAGNOSIS — Z0000 Encounter for general adult medical examination without abnormal findings: Secondary | ICD-10-CM | POA: Diagnosis not present

## 2023-05-11 DIAGNOSIS — Z125 Encounter for screening for malignant neoplasm of prostate: Secondary | ICD-10-CM | POA: Diagnosis not present

## 2023-05-11 DIAGNOSIS — E785 Hyperlipidemia, unspecified: Secondary | ICD-10-CM

## 2023-05-11 MED ORDER — TRAZODONE HCL 50 MG PO TABS: 50.0000 mg | ORAL_TABLET | Freq: Every evening | ORAL | 1 refills | Status: DC | PRN

## 2023-05-11 NOTE — Progress Notes (Signed)
Subjective:    Patient ID: Dean Martin, male    DOB: 13-Mar-1961, 63 y.o.   MRN: [id number]  HPI Patient presents for yearly preventative medicine examination. He is a pleasant 63 year old male who  has a past medical history of Anxiety.  Insomnia- managed with Trazodone 50 mg at bedtime  Hyperlipidemia - was prescribed Lipitor in the past but has not taken this in over a year.  Lab Results  Component Value Date   CHOL 196 05/03/2021   HDL 63.60 05/03/2021   LDLCALC 119 (H) 05/03/2021   TRIG 64.0 05/03/2021   CHOLHDL 3 05/03/2021    All immunizations and health maintenance protocols were reviewed with the patient and needed orders were placed.  Appropriate screening laboratory values were ordered for the patient including screening of hyperlipidemia, renal function and hepatic function. If indicated by BPH, a PSA was ordered.  Medication reconciliation,  past medical history, social history, problem list and allergies were reviewed in detail with the patient  Goals were established with regard to weight loss, exercise, and  diet in compliance with medications. He continues to eat healthy and exercise   He is up to date on routine colon cancer screening.   He has no acute complaints today   Review of Systems  Constitutional: Negative.   HENT: Negative.    Eyes: Negative.   Respiratory: Negative.    Cardiovascular: Negative.   Gastrointestinal: Negative.   Endocrine: Negative.   Genitourinary: Negative.   Musculoskeletal: Negative.   Skin: Negative.   Allergic/Immunologic: Negative.   Neurological: Negative.   Hematological: Negative.   Psychiatric/Behavioral: Negative.    All other systems reviewed and are negative.  Past Medical History:  Diagnosis Date   Anxiety     Social History   Socioeconomic History   Marital status: Single    Spouse name: Not on file   Number of children: 0   Years of education: Not on file   Highest education level: Associate  degree: academic program  Occupational History   Not on file  Tobacco Use   Smoking status: Never   Smokeless tobacco: Never  Vaping Use   Vaping status: Never Used  Substance and Sexual Activity   Alcohol use: Not on file    Comment: OCCASIONAL   Drug use: Never   Sexual activity: Yes    Partners: Female  Other Topics Concern   Not on file  Social History Narrative   Never been married    Left Handed   Lives in a two story home alone    Social Drivers of Health   Financial Resource Strain: Low Risk  (05/10/2023)   Overall Financial Resource Strain (CARDIA)    Difficulty of Paying Living Expenses: Not hard at all  Food Insecurity: No Food Insecurity (05/10/2023)   Hunger Vital Sign    Worried About Running Out of Food in the Last Year: Never true    Ran Out of Food in the Last Year: Never true  Transportation Needs: No Transportation Needs (05/10/2023)   PRAPARE - Administrator, Civil Service (Medical): No    Lack of Transportation (Non-Medical): No  Physical Activity: Unknown (05/10/2023)   Exercise Vital Sign    Days of Exercise per Week: 0 days    Minutes of Exercise per Session: Not on file  Stress: No Stress Concern Present (05/10/2023)   [employer] of Occupational Health - Occupational Stress Questionnaire    Feeling of Stress :  Not at all  Social Connections: Moderately Isolated (05/10/2023)   Social Connection and Isolation Panel [NHANES]    Frequency of Communication with Friends and Family: Three times a week    Frequency of Social Gatherings with Friends and Family: Three times a week    Attends Religious Services: 1 to 4 times per year    Active Member of Clubs or Organizations: No    Attends Banker Meetings: Not on file    Marital Status: Widowed  Intimate Partner Violence: Unknown (04/12/2022)   Received from [facility]   HITS    Physically Hurt: Not on file    Insult or Talk Down To: Not on file     Threaten Physical Harm: Not on file    Scream or Curse: Not on file    History reviewed. No pertinent surgical history.  Family History  Problem Relation Age of Onset   Colon cancer Father    Diabetes Father     No Known Allergies  Current Outpatient Medications on File Prior to Visit  Medication Sig Dispense Refill   fluticasone (FLONASE) 50 MCG/ACT nasal spray PLACE 1 SPRAY INTO BOTH NOSTRILS 2 (TWO) TIMES DAILY 48 mL 1   EPINEPHrine 0.3 mg/0.3 mL IJ SOAJ injection Inject 0.3 mg into the muscle as needed for anaphylaxis. (Patient not taking: Reported on 05/11/2023) 1 each 1   No current facility-administered medications on file prior to visit.    BP 100/80   Pulse 81   Temp 98.1 F (36.7 C) (Oral)   Ht 5' 9.5" (1.765 m)   Wt 165 lb (74.8 kg)   SpO2 98%   BMI 24.02 kg/m       Objective:   Physical Exam Vitals and nursing note reviewed.  Constitutional:      General: He is not in acute distress.    Appearance: Normal appearance. He is not ill-appearing.  HENT:     Head: Normocephalic and atraumatic.     Right Ear: Tympanic membrane, ear canal and external ear normal. There is no impacted cerumen.     Left Ear: Tympanic membrane, ear canal and external ear normal. There is no impacted cerumen.     Nose: Nose normal. No congestion or rhinorrhea.     Mouth/Throat:     Mouth: Mucous membranes are moist.     Pharynx: Oropharynx is clear.  Eyes:     Extraocular Movements: Extraocular movements intact.     Conjunctiva/sclera: Conjunctivae normal.     Pupils: Pupils are equal, round, and reactive to light.  Neck:     Vascular: No carotid bruit.  Cardiovascular:     Rate and Rhythm: Normal rate and regular rhythm.     Pulses: Normal pulses.     Heart sounds: No murmur heard.    No friction rub. No gallop.  Pulmonary:     Effort: Pulmonary effort is normal.     Breath sounds: Normal breath sounds.  Abdominal:     General: Abdomen is flat. Bowel sounds are normal.  There is no distension.     Palpations: Abdomen is soft. There is no mass.     Tenderness: There is no abdominal tenderness. There is no guarding or rebound.     Hernia: No hernia is present.  Musculoskeletal:        General: Normal range of motion.     Cervical back: Normal range of motion and neck supple.  Lymphadenopathy:     Cervical: No  cervical adenopathy.  Skin:    General: Skin is warm and dry.     Capillary Refill: Capillary refill takes less than 2 seconds.  Neurological:     General: No focal deficit present.     Mental Status: He is alert and oriented to person, place, and time.  Psychiatric:        Mood and Affect: Mood normal.        Behavior: Behavior normal.        Thought Content: Thought content normal.        Judgment: Judgment normal.        Assessment & Plan:   1. Routine general medical examination at a health care facility (Primary) Today patient counseled on age appropriate routine health concerns for screening and prevention, each reviewed and up to date or declined. Immunizations reviewed and up to date or declined. Labs ordered and reviewed. Risk factors for depression reviewed and negative. Hearing function and visual acuity are intact. ADLs screened and addressed as needed. Functional ability and level of safety reviewed and appropriate. Education, counseling and referrals performed based on assessed risks today. Patient provided with a copy of personalized plan for preventive services. - Follow up in one year or sooner if needed - Continue to eat healthy and exercise   2. Insomnia, unspecified type - Continue with Trazodone 50 mg at bedtime  - Lipid panel; Future - TSH; Future - CBC; Future - Comprehensive metabolic panel; Future - Hemoglobin A1c; Future - traZODone (DESYREL) 50 MG tablet; Take 1 tablet (50 mg total) by mouth at bedtime as needed. for sleep  Dispense: 90 tablet; Refill: 1  3. Hyperlipidemia, unspecified hyperlipidemia type - Will  do CT calcium scoring and then decide if he needs to be on statin  - Lipid panel; Future - TSH; Future - CBC; Future - Comprehensive metabolic panel; Future - Hemoglobin A1c; Future - CT CARDIAC SCORING (SELF PAY ONLY); Future  4. Prostate cancer screening  - PSA; Future   Shirline Frees, NP

## 2023-05-14 IMAGING — MR MR HEAD WO/W CM
6 of 12 series · 26 of 48 positions shown · IV contrast (gadavist)
Comparison: None.

CLINICAL DATA: Right hand weakness.

EXAM:
MRI HEAD WITHOUT AND WITH CONTRAST
TECHNIQUE: Multiplanar, multiecho pulse sequences of the brain and surrounding
structures were obtained without and with intravenous contrast.
CONTRAST:  7mL GADAVIST GADOBUTROL 1 MMOL/ML IV SOLN

[Series 2: DWI · axial · 3.0mm · 0.94mm/px · z∈[-125,+35]mm · 8 of 110 slices shown (1 of 2)]
[im 1/110]
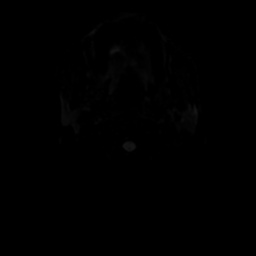
[im 16/110]
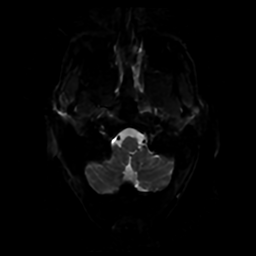
[im 32/110]
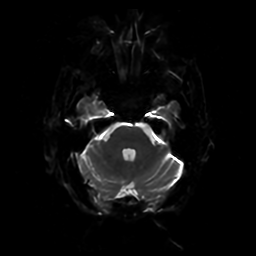
[im 47/110]
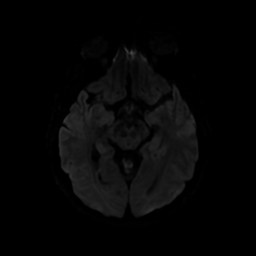
[im 63/110]
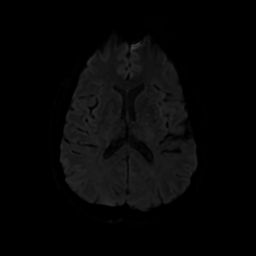
[im 78/110]
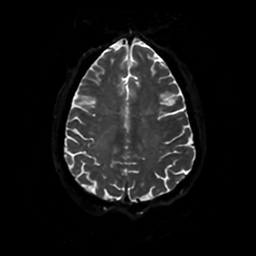
[im 94/110]
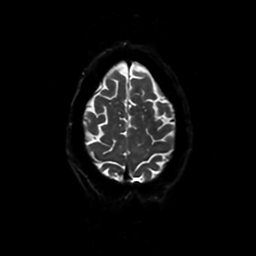
[im 110/110]
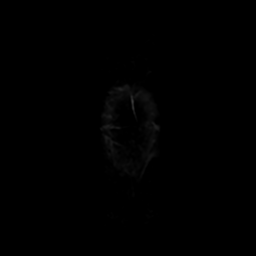

[Series 3: DWI · coronal · 4.0mm · 0.94mm/px · 6 of 74 slices shown (2 of 2)]
[im 1/74]
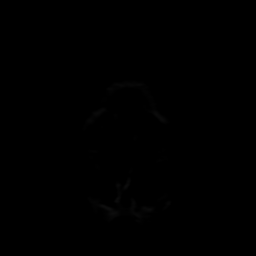
[im 15/74]
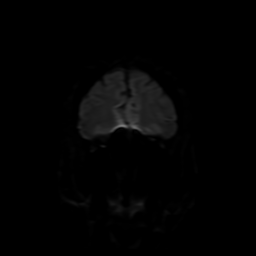
[im 30/74]
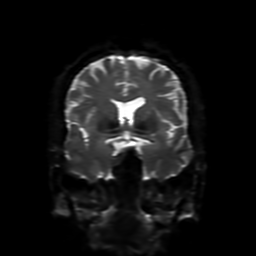
[im 44/74]
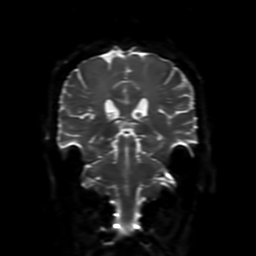
[im 59/74]
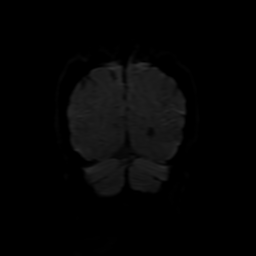
[im 74/74]
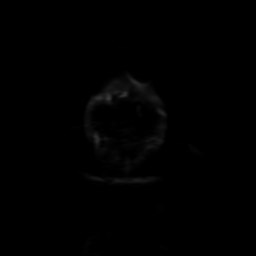

[Series 4: FLAIR · sagittal · 5.0mm · 0.23mm/px · 2 of 25 slices shown (1 of 2)]
[im 1/25]
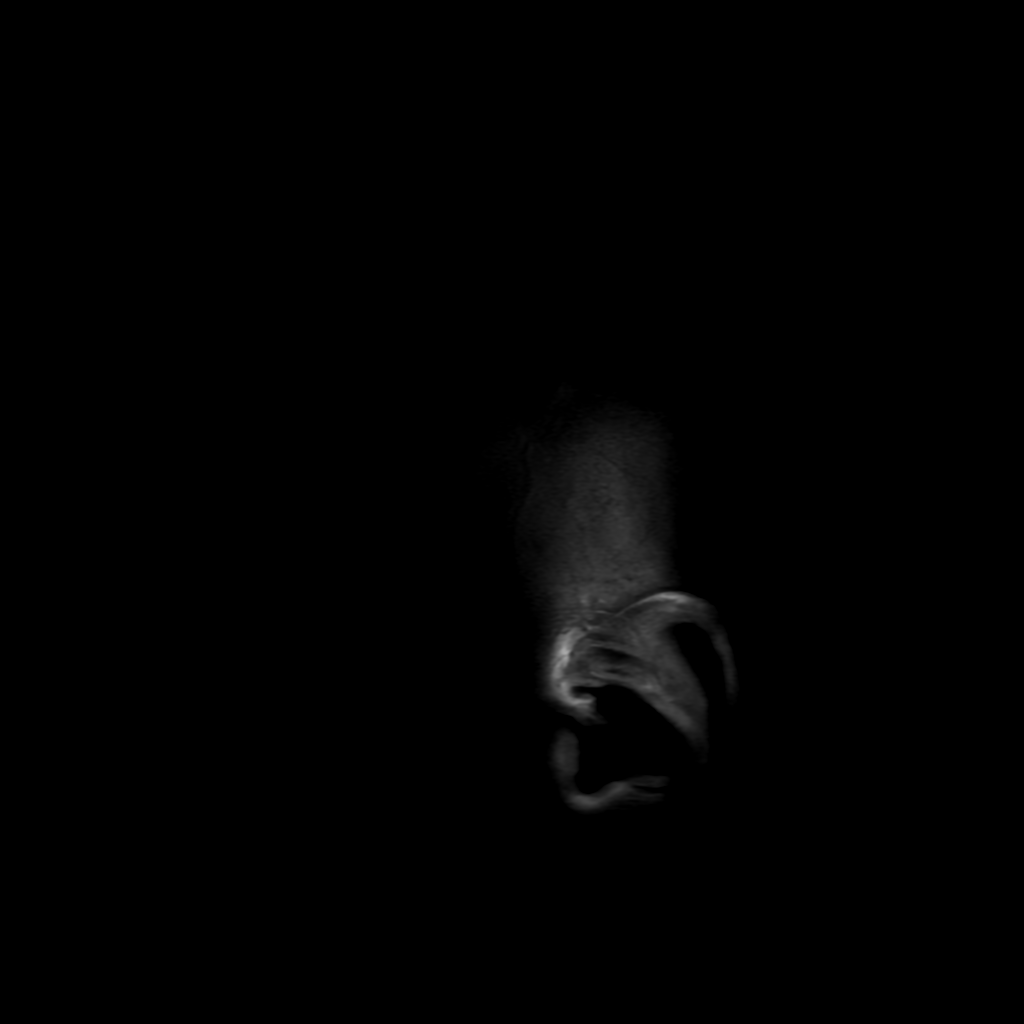
[im 25/25]
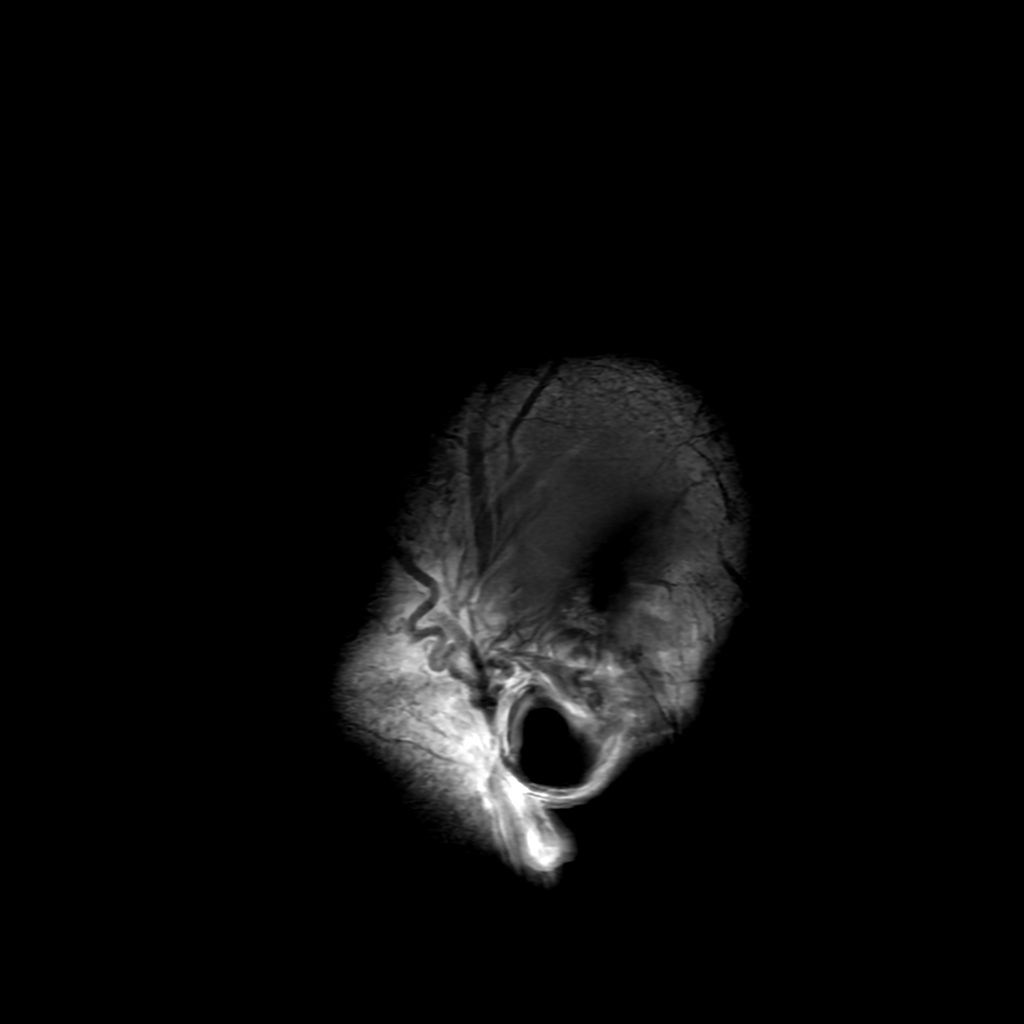

[Series 6: FLAIR · axial · 4.0mm · 0.45mm/px · z∈[-106,+52]mm · 3 of 37 slices shown (2 of 2)]
[im 1/37]
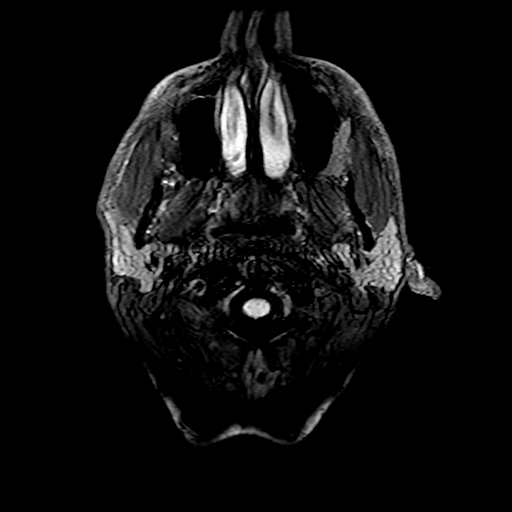
[im 19/37]
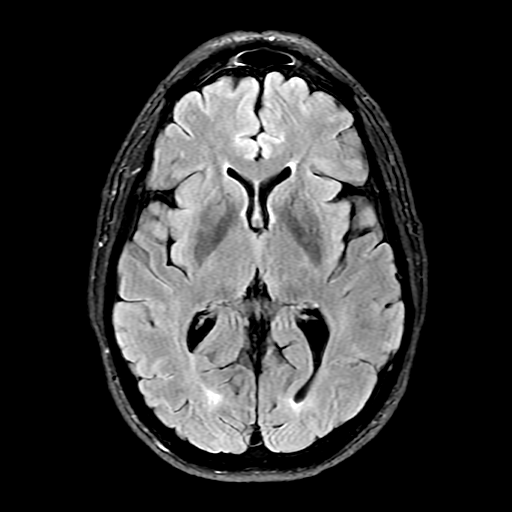
[im 37/37]
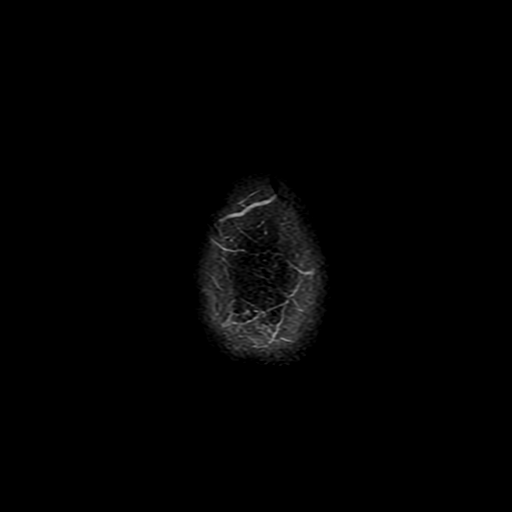

[Series 250: ADC · axial · 3.0mm · 0.94mm/px · z∈[-125,+35]mm · 4 of 55 slices shown (1 of 2)]
[im 1/55]
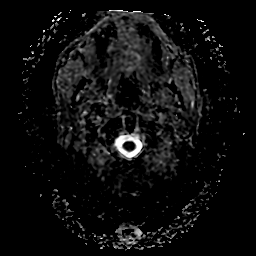
[im 19/55]
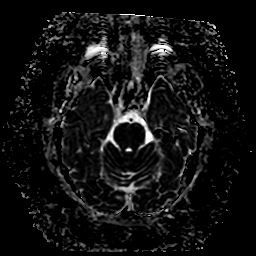
[im 37/55]
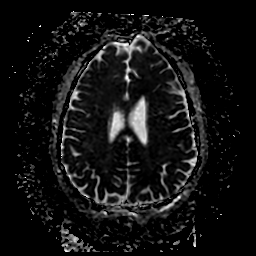
[im 55/55]
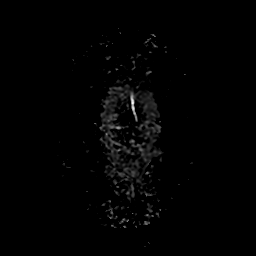

[Series 350: ADC · coronal · 4.0mm · 0.94mm/px · 3 of 36 slices shown (2 of 2)]
[im 1/36]
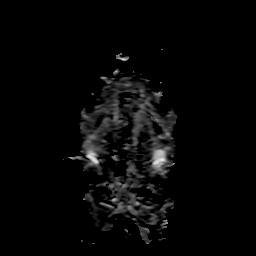
[im 18/36]
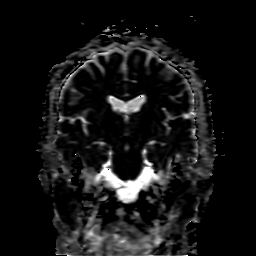
[im 36/36]
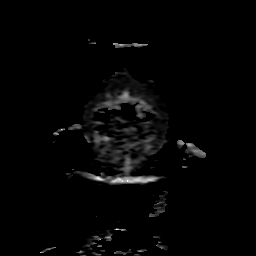

[26 of 48 positions shown; findings below may reference images not displayed]

FINDINGS: Brain: No acute infarct, mass effect or extra-axial collection. No
acute or chronic hemorrhage. Minimal multifocal hyperintense
T2-weight signal within the white matter. The midline structures are
normal. There is no abnormal contrast enhancement.

Vascular: Major flow voids are preserved.

Skull and upper cervical spine: Normal calvarium and skull base.
Visualized upper cervical spine and soft tissues are normal.

Sinuses/Orbits:No paranasal sinus fluid levels or advanced mucosal
thickening. No mastoid or middle ear effusion. Normal orbits.
IMPRESSION: 1. No acute intracranial abnormality.
2. No evidence of demyelinating disease.

## 2023-05-16 ENCOUNTER — Other Ambulatory Visit: Payer: 59

## 2023-05-16 ENCOUNTER — Encounter: Payer: Self-pay | Admitting: Adult Health

## 2023-05-16 DIAGNOSIS — E785 Hyperlipidemia, unspecified: Secondary | ICD-10-CM

## 2023-05-16 DIAGNOSIS — G4700 Insomnia, unspecified: Secondary | ICD-10-CM | POA: Diagnosis not present

## 2023-05-16 DIAGNOSIS — Z125 Encounter for screening for malignant neoplasm of prostate: Secondary | ICD-10-CM | POA: Diagnosis not present

## 2023-05-16 LAB — COMPREHENSIVE METABOLIC PANEL
ALT: 13 U/L (ref 0–53)
AST: 16 U/L (ref 0–37)
Albumin: 4.4 g/dL (ref 3.5–5.2)
Alkaline Phosphatase: 44 U/L (ref 39–117)
BUN: 23 mg/dL (ref 6–23)
CO2: 27 meq/L (ref 19–32)
Calcium: 9.4 mg/dL (ref 8.4–10.5)
Chloride: 102 meq/L (ref 96–112)
Creatinine, Ser: 1.06 mg/dL (ref 0.40–1.50)
GFR: 74.38 mL/min (ref >60.00)
Glucose, Bld: 94 mg/dL (ref 70–99)
Potassium: 4.5 meq/L (ref 3.5–5.1)
Sodium: 137 meq/L (ref 135–145)
Total Bilirubin: 1.3 mg/dL — ABNORMAL HIGH (ref 0.2–1.2)
Total Protein: 6.7 g/dL (ref 6.0–8.3)

## 2023-05-16 LAB — LIPID PANEL
Cholesterol: 265 mg/dL — ABNORMAL HIGH (ref 0–200)
HDL: 62.5 mg/dL (ref >39.00)
LDL Cholesterol: 182 mg/dL — ABNORMAL HIGH (ref 0–99)
NonHDL: 202.36
Total CHOL/HDL Ratio: 4
Triglycerides: 104 mg/dL (ref 0.0–149.0)
VLDL: 20.8 mg/dL (ref 0.0–40.0)

## 2023-05-16 LAB — CBC
HCT: 48.4 % (ref 39.0–52.0)
Hemoglobin: 16.2 g/dL (ref 13.0–17.0)
MCHC: 33.5 g/dL (ref 30.0–36.0)
MCV: 91.4 fL (ref 78.0–100.0)
Platelets: 286 10*3/uL (ref 150.0–400.0)
RBC: 5.3 Mil/uL (ref 4.22–5.81)
RDW: 13.4 % (ref 11.5–15.5)
WBC: 6.3 10*3/uL (ref 4.0–10.5)

## 2023-05-16 LAB — TSH: TSH: 1.97 u[IU]/mL (ref 0.35–5.50)

## 2023-05-16 LAB — HEMOGLOBIN A1C: Hgb A1c MFr Bld: 6 % (ref 4.6–6.5)

## 2023-05-16 LAB — PSA: PSA: 0.94 ng/mL (ref 0.10–4.00)

## 2023-05-17 MED ORDER — ATORVASTATIN CALCIUM 20 MG PO TABS: 20.0000 mg | ORAL_TABLET | Freq: Every day | ORAL | 1 refills | Status: DC

## 2023-05-17 NOTE — Addendum Note (Signed)
 Addended by: Twyla Galeazzi R on: 05/17/2023 11:48 AM   Modules accepted: Orders

## 2023-05-21 ENCOUNTER — Ambulatory Visit (HOSPITAL_COMMUNITY): Payer: 59

## 2023-07-27 ENCOUNTER — Other Ambulatory Visit: Payer: Self-pay | Admitting: Adult Health

## 2023-12-26 ENCOUNTER — Ambulatory Visit: Payer: Self-pay

## 2023-12-26 NOTE — Telephone Encounter (Signed)
 FYI Only or Action Required?: FYI only for provider.  Patient was last seen in primary care on 05/11/2023 by Merna Huxley, NP.  Called Nurse Triage reporting Mass.  Symptoms began about a month ago.  Interventions attempted: Nothing.  Symptoms are: unchanged.  Triage Disposition: See PCP When Office is Open (Within 3 Days)  Patient/caregiver understands and will follow disposition?: Yes  **Appt. Scheduled with PCP on 9/18.**          Copied from CRM [id number]. Topic: Clinical - Red Word Triage >> Dec 26, 2023  9:33 AM Pinkey ORN wrote: Red Word that prompted transfer to Nurse Triage: Lump >> Dec 26, 2023  9:34 AM Pinkey ORN wrote: Patient states he has a lump on the back of his neck, states it's causing him some pain and discomfort.  Reason for Disposition  [1] Small swelling or lump AND [2] unexplained AND [3] present > 1 week  Answer Assessment - Initial Assessment Questions 1. What does it look like?       Back of neck, there is a small lump he noticed it x 2 months ago.   2. SIZE: How large is the swelling? (e.g., inches, cm; or compare to size of pinhead, tip of pen, eraser, coin, pea, grape, ping pong ball)       Small size is all he's described    There is pain associated with the lump. (Moderate)        5. COLOR: What color is it? Is there more than one color?     Same skin tone    6. PAIN: Is there any pain? If Yes, ask: How bad is the pain? (Scale 1-10; or mild, moderate, severe)        moderate   7. ITCH: Does it itch? If Yes, ask: How bad is the itch?      No    8. CAUSE: Unknown        9 OTHER SYMPTOMS: Do you have any other symptoms? (e.g., fever)      No other symptom noted. Appt. Scheduled with PCP on 9/18.  Protocols used: Skin Lump or Localized Swelling-A-AH

## 2023-12-27 ENCOUNTER — Encounter: Payer: Self-pay | Admitting: Adult Health

## 2023-12-27 ENCOUNTER — Ambulatory Visit (INDEPENDENT_AMBULATORY_CARE_PROVIDER_SITE_OTHER): Admitting: Adult Health

## 2023-12-27 ENCOUNTER — Ambulatory Visit
Admission: RE | Admit: 2023-12-27 | Discharge: 2023-12-27 | Disposition: A | Discharge status: Home / Self Care | Source: Ambulatory Visit | Attending: Adult Health

## 2023-12-27 VITALS — BP 120/72 | HR 86 | Temp 97.8°F | Ht 69.5 in | Wt 157.0 lb

## 2023-12-27 DIAGNOSIS — M439 Deforming dorsopathy, unspecified: Secondary | ICD-10-CM | POA: Diagnosis not present

## 2023-12-27 DIAGNOSIS — R051 Acute cough: Secondary | ICD-10-CM

## 2023-12-27 MED ORDER — BENZONATATE 200 MG PO CAPS: 200.0000 mg | ORAL_CAPSULE | Freq: Two times a day (BID) | ORAL | 3 refills | Status: DC | PRN

## 2023-12-27 NOTE — Progress Notes (Signed)
 Subjective:    Patient ID: Dean Martin, male    DOB: 1961/03/17, 63 y.o.   MRN: [id number]  HPI 63 year old male who  has a past medical history of Anxiety.  He presents to the office today for concern of a  knot on the back of his neck.  He does not feel as though this knot has always been there.  He reports last week [place] and reached back and felt the knot. He denies pain or loss of range of motion. Has not had any trauma or aggravating injury.   Additionally, he is only here for a little congested and he developed a dry cough this time a year. He has used Tessalon  in the past and this works well for him. He would like to have a refill.    Review of Systems See HPI   Past Medical History:  Diagnosis Date   Anxiety     Social History   Socioeconomic History   Marital status: Single    Spouse name: Not on file   Number of children: 0   Years of education: Not on file   Highest education level: Associate degree: occupational, Scientist, product/process development, or vocational program  Occupational History   Not on file  Tobacco Use   Smoking status: Never   Smokeless tobacco: Never  Vaping Use   Vaping status: Never Used  Substance and Sexual Activity   Alcohol use: Not on file    Comment: OCCASIONAL   Drug use: Never   Sexual activity: Yes    Partners: Female  Other Topics Concern   Not on file  Social History Narrative   Never been married    Left Handed   Lives in a two story home alone    Social Drivers of Health   Financial Resource Strain: Low Risk  (12/26/2023)   Overall Financial Resource Strain (CARDIA)    Difficulty of Paying Living Expenses: Not hard at all  Food Insecurity: No Food Insecurity (12/26/2023)   Hunger Vital Sign    Worried About Running Out of Food in the Last Year: Never true    Ran Out of Food in the Last Year: Never true  Transportation Needs: No Transportation Needs (12/26/2023)   PRAPARE - Scientist, research (physical sciences) (Medical): No    Lack of Transportation (Non-Medical): No  Physical Activity: Inactive (12/26/2023)   Exercise Vital Sign    Days of Exercise per Week: 0 days    Minutes of Exercise per Session: Not on file  Stress: Stress Concern Present (12/26/2023)   [employer] of Occupational Health - Occupational Stress Questionnaire    Feeling of Stress: To some extent  Social Connections: Socially Isolated (12/26/2023)   Social Connection and Isolation Panel    Frequency of Communication with Friends and Family: Never    Frequency of Social Gatherings with Friends and Family: Twice a week    Attends Religious Services: Never    Database administrator or Organizations: No    Attends Engineer, structural: Not on file    Marital Status: Widowed  Intimate Partner Violence: Unknown (04/12/2022)   Received from [facility]   HITS    Physically Hurt: Not on file    Insult or Talk Down To: Not on file    Threaten Physical Harm: Not on file    Scream or Curse: Not on file    No past surgical history on  file.  Family History  Problem Relation Age of Onset   Colon cancer Father    Diabetes Father     No Known Allergies  Current Outpatient Medications on File Prior to Visit  Medication Sig Dispense Refill   atorvastatin  (LIPITOR) 40 MG tablet TAKE 1 TABLET BY MOUTH EVERY DAY 90 tablet 2   EPINEPHrine  0.3 mg/0.3 mL IJ SOAJ injection Inject 0.3 mg into the muscle as needed for anaphylaxis. 1 each 1   fluticasone  (FLONASE ) 50 MCG/ACT nasal spray PLACE 1 SPRAY INTO BOTH NOSTRILS 2 (TWO) TIMES DAILY 48 mL 1   traZODone  (DESYREL ) 50 MG tablet Take 1 tablet (50 mg total) by mouth at bedtime as needed. for sleep 90 tablet 1   atorvastatin  (LIPITOR) 20 MG tablet Take 1 tablet (20 mg total) by mouth daily. 90 tablet 1   No current facility-administered medications on file prior to visit.    BP 120/72   Pulse 86   Temp 97.8 F (36.6 C) (Oral)   Ht 5' 9.5 (1.765 m)   Wt  157 lb (71.2 kg)   SpO2 97%   BMI 22.85 kg/m       Objective:   Physical Exam Vitals and nursing note reviewed.  Constitutional:      Appearance: Normal appearance.  Cardiovascular:     Rate and Rhythm: Normal rate and regular rhythm.     Pulses: Normal pulses.     Heart sounds: Normal heart sounds.  Pulmonary:     Effort: Pulmonary effort is normal.     Breath sounds: Normal breath sounds.  Musculoskeletal:        General: No swelling, tenderness or deformity. Normal range of motion.     Cervical back: No swelling, rigidity, torticollis, tenderness, bony tenderness or crepitus. No pain with movement. Normal range of motion.     Comments: Has some mild protrusion C2.  Skin:    General: Skin is warm and dry.  Neurological:     General: No focal deficit present.     Mental Status: He is alert and oriented to person, place, and time.  Psychiatric:        Mood and Affect: Mood normal.        Behavior: Behavior normal.        Thought Content: Thought content normal.        Judgment: Judgment normal.        Assessment & Plan:  1. Deformity of cervical vertebra (Primary) - I think his protrusion is more likely related to posture.  Will get x-ray today to rule out bony abnormality.  Consider advanced imaging or referral to neurosurgery if needed. - DG Cervical Spine Complete; Future  2. Acute cough  - benzonatate  (TESSALON ) 200 MG capsule; Take 1 capsule (200 mg total) by mouth 2 (two) times daily as needed for cough.  Dispense: 20 capsule; Refill: 3  Shabre Kreher, NP

## 2023-12-28 ENCOUNTER — Other Ambulatory Visit: Payer: Self-pay | Admitting: Adult Health

## 2023-12-28 DIAGNOSIS — G4700 Insomnia, unspecified: Secondary | ICD-10-CM

## 2024-01-02 ENCOUNTER — Ambulatory Visit: Payer: Self-pay | Admitting: Adult Health

## 2024-01-23 ENCOUNTER — Ambulatory Visit (INDEPENDENT_AMBULATORY_CARE_PROVIDER_SITE_OTHER): Admitting: Adult Health

## 2024-01-23 VITALS — BP 110/62 | HR 68 | Temp 98.2°F | Ht 69.5 in | Wt 160.0 lb

## 2024-01-23 DIAGNOSIS — J988 Other specified respiratory disorders: Secondary | ICD-10-CM | POA: Diagnosis not present

## 2024-01-23 MED ORDER — DOXYCYCLINE HYCLATE 100 MG PO CAPS: 100.0000 mg | ORAL_CAPSULE | Freq: Two times a day (BID) | ORAL | 0 refills | Status: DC

## 2024-01-23 MED ORDER — HYDROCODONE BIT-HOMATROP MBR 5-1.5 MG/5ML PO SOLN: 5.0000 mL | Freq: Three times a day (TID) | ORAL | 0 refills | Status: DC | PRN

## 2024-01-23 MED ORDER — PREDNISONE 10 MG PO TABS: ORAL_TABLET | ORAL | 0 refills | Status: DC

## 2024-01-23 MED ORDER — ALBUTEROL SULFATE HFA 108 (90 BASE) MCG/ACT IN AERS: 2.0000 | INHALATION_SPRAY | Freq: Four times a day (QID) | RESPIRATORY_TRACT | 0 refills | Status: DC | PRN

## 2024-01-23 NOTE — Progress Notes (Addendum)
 Subjective:    Patient ID: Dean Martin, male    DOB: [birth year], 63 y.o.   MRN: [id number]  HPI 63 year old male who  has a past medical history of Anxiety.  Discussed the use of AI scribe software for clinical note transcription with the patient, who gave verbal consent to proceed.  History of Present Illness   Dean Martin is a 63 year old male who presents for concern of a respiratory infection.   He experiences persistent headaches described as severe with sinus pressure and pain. The headaches are associated with cough attacks, The cough is semi-productive. He does endorse rhinorrhea, shortness of breath and wheezing as well. He denies fevers or chills.  He is using Flonase  and Advil  for symptom relief.       Review of Systems See HPI   Past Medical History:  Diagnosis Date   Anxiety     Social History   Socioeconomic History   Marital status: Single    Spouse name: Not on file   Number of children: 0   Years of education: Not on file   Highest education level: Associate degree: occupational, Scientist, product/process development, or vocational program  Occupational History   Not on file  Tobacco Use   Smoking status: Never   Smokeless tobacco: Never  Vaping Use   Vaping status: Never Used  Substance and Sexual Activity   Alcohol use: Not on file    Comment: OCCASIONAL   Drug use: Never   Sexual activity: Yes    Partners: Female  Other Topics Concern   Not on file  Social History Narrative   Never been married    Left Handed   Lives in a two story home alone    Social Drivers of Health   Financial Resource Strain: Low Risk  (01/23/2024)   Overall Financial Resource Strain (CARDIA)    Difficulty of Paying Living Expenses: Not hard at all  Food Insecurity: No Food Insecurity (01/23/2024)   Hunger Vital Sign    Worried About Running Out of Food in the Last Year: Never true    Ran Out of Food in the Last Year: Never true  Transportation Needs: No Transportation Needs  (01/23/2024)   PRAPARE - Administrator, Civil Service (Medical): No    Lack of Transportation (Non-Medical): No  Physical Activity: Inactive (01/23/2024)   Exercise Vital Sign    Days of Exercise per Week: 1 day    Minutes of Exercise per Session: 0 min  Stress: No Stress Concern Present (01/23/2024)   [employer] of Occupational Health - Occupational Stress Questionnaire    Feeling of Stress: Only a little  Recent Concern: Stress - Stress Concern Present (12/26/2023)   [employer] of Occupational Health - Occupational Stress Questionnaire    Feeling of Stress: To some extent  Social Connections: Socially Isolated (01/23/2024)   Social Connection and Isolation Panel    Frequency of Communication with Friends and Family: Never    Frequency of Social Gatherings with Friends and Family: Never    Attends Religious Services: Never    Database administrator or Organizations: No    Attends Engineer, structural: Not on file    Marital Status: Never married  Intimate Partner Violence: Unknown (04/12/2022)   Received from [facility]   HITS    Physically Hurt: Not on file    Insult or Talk Down To: Not on file    Threaten Physical Harm: Not  on file    Scream or Curse: Not on file    History reviewed. No pertinent surgical history.  Family History  Problem Relation Age of Onset   Colon cancer Father    Diabetes Father     No Known Allergies  Current Outpatient Medications on File Prior to Visit  Medication Sig Dispense Refill   atorvastatin  (LIPITOR) 40 MG tablet TAKE 1 TABLET BY MOUTH EVERY DAY 90 tablet 2   EPINEPHrine  0.3 mg/0.3 mL IJ SOAJ injection Inject 0.3 mg into the muscle as needed for anaphylaxis. 1 each 1   fluticasone  (FLONASE ) 50 MCG/ACT nasal spray PLACE 1 SPRAY INTO BOTH NOSTRILS 2 (TWO) TIMES DAILY 48 mL 1   traZODone  (DESYREL ) 50 MG tablet TAKE 1 TABLET (50 MG TOTAL) BY MOUTH AT BEDTIME AS NEEDED. FOR SLEEP 90 tablet 1    benzonatate  (TESSALON ) 200 MG capsule Take 1 capsule (200 mg total) by mouth 2 (two) times daily as needed for cough. (Patient not taking: Reported on 01/23/2024) 20 capsule 3   No current facility-administered medications on file prior to visit.    BP 110/62   Pulse 68   Temp 98.2 F (36.8 C) (Oral)   Ht 5' 9.5 (1.765 m)   Wt 160 lb (72.6 kg)   SpO2 97%   BMI 23.29 kg/m       Objective:   Physical Exam Vitals and nursing note reviewed.  Constitutional:      Appearance: Normal appearance.  HENT:     Right Ear: Tympanic membrane, ear canal and external ear normal. There is no impacted cerumen.     Left Ear: Tympanic membrane, ear canal and external ear normal. There is no impacted cerumen.     Nose: Congestion and rhinorrhea present. Rhinorrhea is purulent.     Right Turbinates: Enlarged and swollen.     Left Turbinates: Enlarged and swollen.     Right Sinus: Maxillary sinus tenderness and frontal sinus tenderness present.     Left Sinus: Maxillary sinus tenderness and frontal sinus tenderness present.     Mouth/Throat:     Pharynx: Oropharynx is clear. Uvula midline.  Cardiovascular:     Rate and Rhythm: Normal rate and regular rhythm.     Pulses: Normal pulses.     Heart sounds: Normal heart sounds.  Pulmonary:     Effort: Pulmonary effort is normal.     Breath sounds: Examination of the right-upper field reveals wheezing. Examination of the left-upper field reveals wheezing. Examination of the right-middle field reveals wheezing. Examination of the left-middle field reveals wheezing. Wheezing present. No rhonchi or rales.  Skin:    General: Skin is warm and dry.  Neurological:     General: No focal deficit present.     Mental Status: He is alert and oriented to person, place, and time.  Psychiatric:        Mood and Affect: Mood normal.        Behavior: Behavior normal.        Thought Content: Thought content normal.        Judgment: Judgment normal.         Assessment & Plan:  1. Respiratory infection (Primary) - Symptoms on going for about four weeks. Will treat with doxy and steroids. He was wheezy but no rhonchi noted. Will send in albuterol inhaler to use PRN.  - Follow up if no improvement in the next 3-4 days or sooner if fever develops  - doxycycline (VIBRAMYCIN) 100 MG  capsule; Take 1 capsule (100 mg total) by mouth 2 (two) times daily.  Dispense: 14 capsule; Refill: 0 - predniSONE  (DELTASONE ) 10 MG tablet; 40 mg x 3 days, 20 mg x 3 days, 10 mg x 3 days  Dispense: 21 tablet; Refill: 0 - albuterol (VENTOLIN HFA) 108 (90 Base) MCG/ACT inhaler; Inhale 2 puffs into the lungs every 6 (six) hours as needed for wheezing or shortness of breath.  Dispense: 8 g; Refill: 0 - HYDROcodone bit-homatropine (HYCODAN) 5-1.5 MG/5ML syrup; Take 5 mLs by mouth every 8 (eight) hours as needed for cough.  Dispense: 120 mL; Refill: 0  Darleene Shape, NP  I personally spent a total of 30 minutes in the care of the patient today including preparing to see the patient, getting/reviewing separately obtained history, performing a medically appropriate exam/evaluation, counseling and educating, placing orders, and documenting clinical information in the EHR.

## 2024-02-25 ENCOUNTER — Telehealth: Payer: Self-pay | Admitting: Adult Health

## 2024-02-25 ENCOUNTER — Other Ambulatory Visit: Payer: Self-pay | Admitting: Adult Health

## 2024-02-25 DIAGNOSIS — G4700 Insomnia, unspecified: Secondary | ICD-10-CM

## 2024-02-25 DIAGNOSIS — J988 Other specified respiratory disorders: Secondary | ICD-10-CM

## 2024-02-25 NOTE — Telephone Encounter (Unsigned)
 Copied from CRM #[id number]. Topic: Clinical - Medication Refill >> Feb 25, 2024  3:03 PM Viola F wrote: Medication: traZODone  (DESYREL ) 50 MG tablet [[id number]]  Has the patient contacted their pharmacy? Yes (Agent: If no, request that the patient contact the pharmacy for the refill. If patient does not wish to contact the pharmacy document the reason why and proceed with request.) (Agent: If yes, when and what did the pharmacy advise?)  This is the patient's preferred pharmacy:  [id number] - [place], [place] - [address] AT [facility] [address] [place] [address] Phone: [phone] Fax: [phone]  Is this the correct pharmacy for this prescription? Yes If no, delete pharmacy and type the correct one.   Has the prescription been filled recently? Yes  Is the patient out of the medication? Yes  Has the patient been seen for an appointment in the last year OR does the patient have an upcoming appointment? Yes  Can we respond through MyChart? Yes  Agent: Please be advised that Rx refills may take up to 3 business days. We ask that you follow-up with your pharmacy.

## 2024-02-26 ENCOUNTER — Telehealth: Payer: Self-pay | Admitting: *Deleted

## 2024-02-26 MED ORDER — TRAZODONE HCL 100 MG PO TABS: 100.0000 mg | ORAL_TABLET | Freq: Every evening | ORAL | 0 refills | Status: DC | PRN

## 2024-02-26 NOTE — Telephone Encounter (Signed)
 noted

## 2024-02-26 NOTE — Telephone Encounter (Signed)
 Left VM to return call to office to give us  update on respiratory infection before refills of doxy and prednisone  be sent.

## 2024-02-26 NOTE — Telephone Encounter (Signed)
 Copied from CRM [id number]. Topic: Clinical - Prescription Issue >> Feb 26, 2024  8:21 AM Darshell M wrote: Reason for CRM: Patient mistakenly ordered medication refills from pharmacy through the automated system. Patient indicated he does not need the medications. It was an error.

## 2024-04-11 ENCOUNTER — Other Ambulatory Visit: Payer: Self-pay | Admitting: Adult Health

## 2024-04-11 NOTE — Telephone Encounter (Signed)
 Patient need to schedule CPE for more refills.

## 2024-04-30 ENCOUNTER — Other Ambulatory Visit: Payer: Self-pay | Admitting: Adult Health

## 2024-04-30 ENCOUNTER — Telehealth: Payer: Self-pay

## 2024-04-30 ENCOUNTER — Other Ambulatory Visit: Payer: Self-pay | Admitting: *Deleted

## 2024-04-30 DIAGNOSIS — J988 Other specified respiratory disorders: Secondary | ICD-10-CM

## 2024-04-30 DIAGNOSIS — G4700 Insomnia, unspecified: Secondary | ICD-10-CM

## 2024-04-30 MED ORDER — TRAZODONE HCL 100 MG PO TABS: 100.0000 mg | ORAL_TABLET | Freq: Every evening | ORAL | 0 refills | Status: DC | PRN

## 2024-04-30 MED ORDER — ALBUTEROL SULFATE HFA 108 (90 BASE) MCG/ACT IN AERS: 2.0000 | INHALATION_SPRAY | Freq: Four times a day (QID) | RESPIRATORY_TRACT | 0 refills | Status: AC | PRN

## 2024-04-30 NOTE — Telephone Encounter (Signed)
 Copied from CRM #[id number]. Topic: Clinical - Prescription Issue >> Apr 30, 2024  1:26 PM Chasity T wrote: Reason for CRM: patient has called requesting for an refill on medication traZODone  (DESYREL ) 100 MG tablet. I advised him  that a CPE needs to be done before any refills. He made an appointment for CPE but is requesting for an bridge refill on medication traZODone  (DESYREL ) 100 MG tablet. Please contact patient to let him know if it could be sent.  Phone number: [phone]

## 2024-04-30 NOTE — Telephone Encounter (Signed)
 Copied from CRM [id number]. Topic: Clinical - Medication Refill >> Apr 28, 2024  8:48 AM Mesmerise C wrote: Medication: traZODone  (DESYREL ) 100 MG tablet albuterol  (VENTOLIN  HFA) 108 (90 Base) MCG/ACT inhaler  Has the patient contacted their pharmacy? Yes (Agent: If no, request that the patient contact the pharmacy for the refill. If patient does not wish to contact the pharmacy document the reason why and proceed with request.) (Agent: If yes, when and what did the pharmacy advise?)  This is the patient's preferred pharmacy:  [id number] - [place], [place] - [address] AT [place] [address] [place] [address] Phone: [phone] Fax: [phone]  Is this the correct pharmacy for this prescription? Yes If no, delete pharmacy and type the correct one.   Has the prescription been filled recently? No  Is the patient out of the medication? Yes  Has the patient been seen for an appointment in the last year OR does the patient have an upcoming appointment? Yes  Can we respond through MyChart? Yes  Agent: Please be advised that Rx refills may take up to 3 business days. We ask that you follow-up with your pharmacy.

## 2024-04-30 NOTE — Telephone Encounter (Signed)
 Copied from CRM [id number]. Topic: Clinical - Medication Refill >> Apr 28, 2024  8:48 AM Mesmerise C wrote: Medication: traZODone  (DESYREL ) 100 MG tablet albuterol  (VENTOLIN  HFA) 108 (90 Base) MCG/ACT inhaler  Has the patient contacted their pharmacy? Yes (Agent: If no, request that the patient contact the pharmacy for the refill. If patient does not wish to contact the pharmacy document the reason why and proceed with request.) (Agent: If yes, when and what did the pharmacy advise?)  This is the patient's preferred pharmacy:  [id number] - [place], [place] - [address] AT [facility] [address] [place] [address] Phone: [phone] Fax: [phone]  Is this the correct pharmacy for this prescription? Yes If no, delete pharmacy and type the correct one.   Has the prescription been filled recently? No  Is the patient out of the medication? Yes  Has the patient been seen for an appointment in the last year OR does the patient have an upcoming appointment? Yes  Can we respond through MyChart? Yes  Agent: Please be advised that Rx refills may take up to 3 business days. We ask that you follow-up with your pharmacy.

## 2024-05-01 ENCOUNTER — Other Ambulatory Visit: Payer: Self-pay

## 2024-05-01 DIAGNOSIS — G4700 Insomnia, unspecified: Secondary | ICD-10-CM

## 2024-05-01 MED ORDER — TRAZODONE HCL 100 MG PO TABS: 100.0000 mg | ORAL_TABLET | Freq: Every evening | ORAL | 0 refills | Status: DC | PRN

## 2024-05-01 NOTE — Telephone Encounter (Signed)
Prescription sent to pharmacy. No other action needed.  

## 2024-05-15 ENCOUNTER — Ambulatory Visit: Admitting: Adult Health

## 2024-05-15 ENCOUNTER — Encounter: Payer: Self-pay | Admitting: Adult Health

## 2024-05-15 VITALS — BP 100/80 | HR 77 | Temp 98.9°F | Ht 69.5 in | Wt 160.0 lb

## 2024-05-15 DIAGNOSIS — R7303 Prediabetes: Secondary | ICD-10-CM

## 2024-05-15 DIAGNOSIS — G4700 Insomnia, unspecified: Secondary | ICD-10-CM

## 2024-05-15 DIAGNOSIS — Z125 Encounter for screening for malignant neoplasm of prostate: Secondary | ICD-10-CM

## 2024-05-15 DIAGNOSIS — Z0000 Encounter for general adult medical examination without abnormal findings: Secondary | ICD-10-CM

## 2024-05-15 DIAGNOSIS — E785 Hyperlipidemia, unspecified: Secondary | ICD-10-CM

## 2024-05-15 MED ORDER — TRAZODONE HCL 100 MG PO TABS: 100.0000 mg | ORAL_TABLET | Freq: Every evening | ORAL | 1 refills | Status: AC | PRN

## 2024-05-15 MED ORDER — ATORVASTATIN CALCIUM 40 MG PO TABS: 40.0000 mg | ORAL_TABLET | Freq: Every day | ORAL | 3 refills | Status: AC

## 2024-05-15 NOTE — Progress Notes (Signed)
 "  Subjective:    Patient ID: Dean Martin, male    DOB: 1960/04/26, 64 y.o.   MRN: [id number]  HPI Patient presents for yearly preventative medicine examination. He is  pleasant 64 year old male who  has a past medical history of Anxiety.  Insomnia- managed with Trazodone  50 mg at bedtime. He feels well rested when he wakes up   Hyperlipidemia -he has started taking  Lipitor 40 mg daily. He is tolerating this well  CT Cardiac scoring was ordered last year but was never completed.  Lab Results  Component Value Date   CHOL 265 (H) 05/16/2023   HDL 62.50 05/16/2023   LDLCALC 182 (H) 05/16/2023   TRIG 104.0 05/16/2023   CHOLHDL 4 05/16/2023   Prediabetes - not currently on medication  Lab Results  Component Value Date   HGBA1C 6.0 05/16/2023   HGBA1C 5.9 04/16/2020   All immunizations and health maintenance protocols were reviewed with the patient and needed orders were placed.  Appropriate screening laboratory values were ordered for the patient including screening of hyperlipidemia, renal function and hepatic function. If indicated by BPH, a PSA was ordered.  Medication reconciliation,  past medical history, social history, problem list and allergies were reviewed in detail with the patient  Goals were established with regard to weight loss, exercise, and  diet in compliance with medications. He does stay active and eat healthy. a  Wt Readings from Last 3 Encounters:  05/15/24 160 lb (72.6 kg)  01/23/24 160 lb (72.6 kg)  12/27/23 157 lb (71.2 kg)   He is up to date on routine colon cancer screening  Review of Systems  Constitutional: Negative.   HENT: Negative.    Eyes: Negative.   Respiratory: Negative.    Cardiovascular: Negative.   Gastrointestinal: Negative.   Endocrine: Negative.   Genitourinary: Negative.   Musculoskeletal: Negative.   Skin: Negative.   Allergic/Immunologic: Negative.   Neurological: Negative.   Hematological: Negative.    Psychiatric/Behavioral: Negative.    All other systems reviewed and are negative.  Past Medical History:  Diagnosis Date   Anxiety     Social History   Socioeconomic History   Marital status: Single    Spouse name: Not on file   Number of children: 0   Years of education: Not on file   Highest education level: Associate degree: occupational, scientist, product/process development, or vocational program  Occupational History   Not on file  Tobacco Use   Smoking status: Never   Smokeless tobacco: Never  Vaping Use   Vaping status: Never Used  Substance and Sexual Activity   Alcohol use: Not on file    Comment: OCCASIONAL   Drug use: Never   Sexual activity: Yes    Partners: Female  Other Topics Concern   Not on file  Social History Narrative   Never been married    Left Handed   Lives in a two story home alone    Social Drivers of Health   Tobacco Use: Low Risk (05/15/2024)   Patient History    Smoking Tobacco Use: Never    Smokeless Tobacco Use: Never    Passive Exposure: Not on file  Financial Resource Strain: Low Risk (01/23/2024)   Overall Financial Resource Strain (CARDIA)    Difficulty of Paying Living Expenses: Not hard at all  Food Insecurity: No Food Insecurity (01/23/2024)   Epic    Worried About Radiation Protection Practitioner of Food in the Last Year: Never true  Ran Out of Food in the Last Year: Never true  Transportation Needs: No Transportation Needs (01/23/2024)   Epic    Lack of Transportation (Medical): No    Lack of Transportation (Non-Medical): No  Physical Activity: Inactive (01/23/2024)   Exercise Vital Sign    Days of Exercise per Week: 1 day    Minutes of Exercise per Session: 0 min  Stress: No Stress Concern Present (01/23/2024)   [employer] - Occupational Stress Questionnaire    Feeling of Stress: Only a little  Recent Concern: Stress - Stress Concern Present (12/26/2023)   [employer] of Occupational [employer] - Occupational Stress Questionnaire     Feeling of Stress: To some extent  Social Connections: Socially Isolated (01/23/2024)   Social Connection and Isolation Panel    Frequency of Communication with Friends and Family: Never    Frequency of Social Gatherings with Friends and Family: Never    Attends Religious Services: Never    Database Administrator or Organizations: No    Attends Engineer, Structural: Not on file    Marital Status: Never married  Intimate Partner Violence: Unknown (04/12/2022)   Received from [facility]   HITS    Physically Hurt: Not on file    Insult or Talk Down To: Not on file    Threaten Physical Harm: Not on file    Scream or Curse: Not on file  Depression (PHQ2-9): Low Risk (01/23/2024)   Depression (PHQ2-9)    PHQ-2 Score: 0  Alcohol Screen: Not on file  Housing: Low Risk (01/23/2024)   Epic    Unable to Pay for Housing in the Last Year: No    Number of Times Moved in the Last Year: 0    Homeless in the Last Year: No  Utilities: Not on file  Health Literacy: Not on file    History reviewed. No pertinent surgical history.  Family History  Problem Relation Age of Onset   Colon cancer Father    Diabetes Father     Allergies[1]  Medications Ordered Prior to Encounter[2]  BP 100/80   Pulse 77   Temp 98.9 F (37.2 C) (Oral)   Ht 5' 9.5 (1.765 m)   Wt 160 lb (72.6 kg)   SpO2 97%   BMI 23.29 kg/m       Objective:   Physical Exam Vitals and nursing note reviewed.  Constitutional:      General: He is not in acute distress.    Appearance: Normal appearance. He is not ill-appearing.  HENT:     Head: Normocephalic and atraumatic.     Right Ear: Tympanic membrane, ear canal and external ear normal. There is no impacted cerumen.     Left Ear: Tympanic membrane, ear canal and external ear normal. There is no impacted cerumen.     Nose: Nose normal. No congestion or rhinorrhea.     Mouth/Throat:     Mouth: Mucous membranes are moist.     Pharynx: Oropharynx is  clear.  Eyes:     Extraocular Movements: Extraocular movements intact.     Conjunctiva/sclera: Conjunctivae normal.     Pupils: Pupils are equal, round, and reactive to light.  Neck:     Vascular: No carotid bruit.  Cardiovascular:     Rate and Rhythm: Normal rate and regular rhythm.     Pulses: Normal pulses.     Heart sounds: No murmur heard.    No friction rub. No gallop.  Pulmonary:     Effort: Pulmonary effort is normal.     Breath sounds: Normal breath sounds.  Abdominal:     General: Abdomen is flat. Bowel sounds are normal. There is no distension.     Palpations: Abdomen is soft. There is no mass.     Tenderness: There is no abdominal tenderness. There is no guarding or rebound.     Hernia: No hernia is present.  Musculoskeletal:        General: Normal range of motion.     Cervical back: Normal range of motion and neck supple.  Lymphadenopathy:     Cervical: No cervical adenopathy.  Skin:    General: Skin is warm and dry.     Capillary Refill: Capillary refill takes less than 2 seconds.  Neurological:     General: No focal deficit present.     Mental Status: He is alert and oriented to person, place, and time.  Psychiatric:        Mood and Affect: Mood normal.        Behavior: Behavior normal.        Thought Content: Thought content normal.        Judgment: Judgment normal.        Assessment & Plan:  1. Routine general medical examination at a health care facility (Primary) Today patient counseled on age appropriate routine health concerns for screening and prevention, each reviewed and up to date or declined. Immunizations reviewed and up to date or declined. Labs ordered and reviewed. Risk factors for depression reviewed and negative. Hearing function and visual acuity are intact. ADLs screened and addressed as needed. Functional ability and level of safety reviewed and appropriate. Education, counseling and referrals performed based on assessed risks today.  Patient provided with a copy of personalized plan for preventive services. - eat healthy and exercise - Follow up in one year or sooner if needed  2. Insomnia, unspecified type - Continue with trazodone   - Lipid panel; Future - CBC; Future - Comprehensive metabolic panel with GFR; Future - traZODone  (DESYREL ) 100 MG tablet; Take 1 tablet (100 mg total) by mouth at bedtime as needed. for sleep  Dispense: 90 tablet; Refill: 1  3. Hyperlipidemia, unspecified hyperlipidemia type - Consider increase in statin. Consider CT Caclcium score  - Lipid panel; Future - CBC; Future - Comprehensive metabolic panel with GFR; Future - atorvastatin  (LIPITOR) 40 MG tablet; Take 1 tablet (40 mg total) by mouth daily.  Dispense: 90 tablet; Refill: 3  4. Prostate cancer screening  - PSA; Future  5. Prediabetes - Consider Metformin  - Hemoglobin A1c; Future  Darleene Shape, NP     [1] No Known Allergies [2]  Current Outpatient Medications on File Prior to Visit  Medication Sig Dispense Refill   albuterol  (VENTOLIN  HFA) 108 (90 Base) MCG/ACT inhaler Inhale 2 puffs into the lungs every 6 (six) hours as needed for wheezing or shortness of breath. 8 g 0   atorvastatin  (LIPITOR) 40 MG tablet TAKE 1 TABLET BY MOUTH EVERY DAY 90 tablet 2   EPINEPHrine  0.3 mg/0.3 mL IJ SOAJ injection Inject 0.3 mg into the muscle as needed for anaphylaxis. 1 each 1   fluticasone  (FLONASE ) 50 MCG/ACT nasal spray PLACE 1 SPRAY INTO BOTH NOSTRILS 2 (TWO) TIMES DAILY 48 mL 0   traZODone  (DESYREL ) 100 MG tablet Take 1 tablet (100 mg total) by mouth at bedtime as needed. for sleep 15 tablet 0   No current facility-administered medications on file  prior to visit.   "

## 2024-05-21 ENCOUNTER — Other Ambulatory Visit
